# Patient Record
Sex: Female | Born: 1965 | Race: White | Hispanic: No | Smoking: Current some day smoker
Health system: Southern US, Community
[De-identification: ages and names within clinical notes are randomized; demographics above are authoritative.]

## PROBLEM LIST (undated history)

## (undated) DIAGNOSIS — F32A Depression, unspecified: Secondary | ICD-10-CM

## (undated) DIAGNOSIS — F329 Major depressive disorder, single episode, unspecified: Secondary | ICD-10-CM

## (undated) DIAGNOSIS — F419 Anxiety disorder, unspecified: Secondary | ICD-10-CM

## (undated) DIAGNOSIS — I10 Essential (primary) hypertension: Secondary | ICD-10-CM

## (undated) DIAGNOSIS — IMO0002 Reserved for concepts with insufficient information to code with codable children: Secondary | ICD-10-CM

## (undated) DIAGNOSIS — R011 Cardiac murmur, unspecified: Secondary | ICD-10-CM

## (undated) DIAGNOSIS — K219 Gastro-esophageal reflux disease without esophagitis: Secondary | ICD-10-CM

## (undated) DIAGNOSIS — E049 Nontoxic goiter, unspecified: Secondary | ICD-10-CM

## (undated) DIAGNOSIS — S82899A Other fracture of unspecified lower leg, initial encounter for closed fracture: Secondary | ICD-10-CM

## (undated) DIAGNOSIS — M1990 Unspecified osteoarthritis, unspecified site: Secondary | ICD-10-CM

## (undated) HISTORY — DX: Nontoxic goiter, unspecified: E04.9

## (undated) HISTORY — DX: Other fracture of unspecified lower leg, initial encounter for closed fracture: S82.899A

## (undated) HISTORY — DX: Depression, unspecified: F32.A

## (undated) HISTORY — PX: ANKLE FRACTURE SURGERY: SHX122

## (undated) HISTORY — DX: Gastro-esophageal reflux disease without esophagitis: K21.9

## (undated) HISTORY — DX: Anxiety disorder, unspecified: F41.9

## (undated) HISTORY — PX: TUBAL LIGATION: SHX77

## (undated) HISTORY — PX: OTHER SURGICAL HISTORY: SHX169

## (undated) HISTORY — PX: ANKLE SURGERY: SHX546

## (undated) HISTORY — DX: Cardiac murmur, unspecified: R01.1

## (undated) HISTORY — DX: Reserved for concepts with insufficient information to code with codable children: IMO0002

## (undated) HISTORY — PX: ABDOMINAL HYSTERECTOMY: SHX81

## (undated) HISTORY — DX: Major depressive disorder, single episode, unspecified: F32.9

## (undated) HISTORY — DX: Essential (primary) hypertension: I10

---

## 1991-11-18 HISTORY — PX: ABDOMINAL HYSTERECTOMY: SHX81

## 1995-11-18 HISTORY — PX: TUBAL LIGATION: SHX77

## 1999-04-19 ENCOUNTER — Ambulatory Visit (HOSPITAL_COMMUNITY): Admission: RE | Admit: 1999-04-19 | Discharge: 1999-04-19 | Payer: Self-pay | Admitting: Gynecology

## 2002-07-19 ENCOUNTER — Observation Stay (HOSPITAL_COMMUNITY): Admission: RE | Admit: 2002-07-19 | Discharge: 2002-07-20 | Payer: Self-pay | Admitting: Gynecology

## 2002-07-19 ENCOUNTER — Encounter (INDEPENDENT_AMBULATORY_CARE_PROVIDER_SITE_OTHER): Payer: Self-pay | Admitting: Specialist

## 2005-01-02 ENCOUNTER — Inpatient Hospital Stay (HOSPITAL_COMMUNITY): Admission: EM | Admit: 2005-01-02 | Discharge: 2005-01-07 | Payer: Self-pay | Admitting: Emergency Medicine

## 2005-01-14 ENCOUNTER — Inpatient Hospital Stay (HOSPITAL_COMMUNITY): Admission: RE | Admit: 2005-01-14 | Discharge: 2005-01-17 | Payer: Self-pay | Admitting: Orthopaedic Surgery

## 2005-08-25 ENCOUNTER — Encounter: Admission: RE | Admit: 2005-08-25 | Discharge: 2005-08-25 | Payer: Self-pay | Admitting: Orthopaedic Surgery

## 2006-05-10 IMAGING — CR DG CHEST 1V
1 series · 1 of 1 positions shown · non-contrast
Comparison: none

CLINICAL DATA: Preop respiratory exam for right ankle fracture repair.
 SINGLE VIEW CHEST:
 Cardiomediastinal silhouette is unremarkable.  Lungs are clear.  Bony thorax and upper abdomen are grossly unremarkable.

[view not recorded]
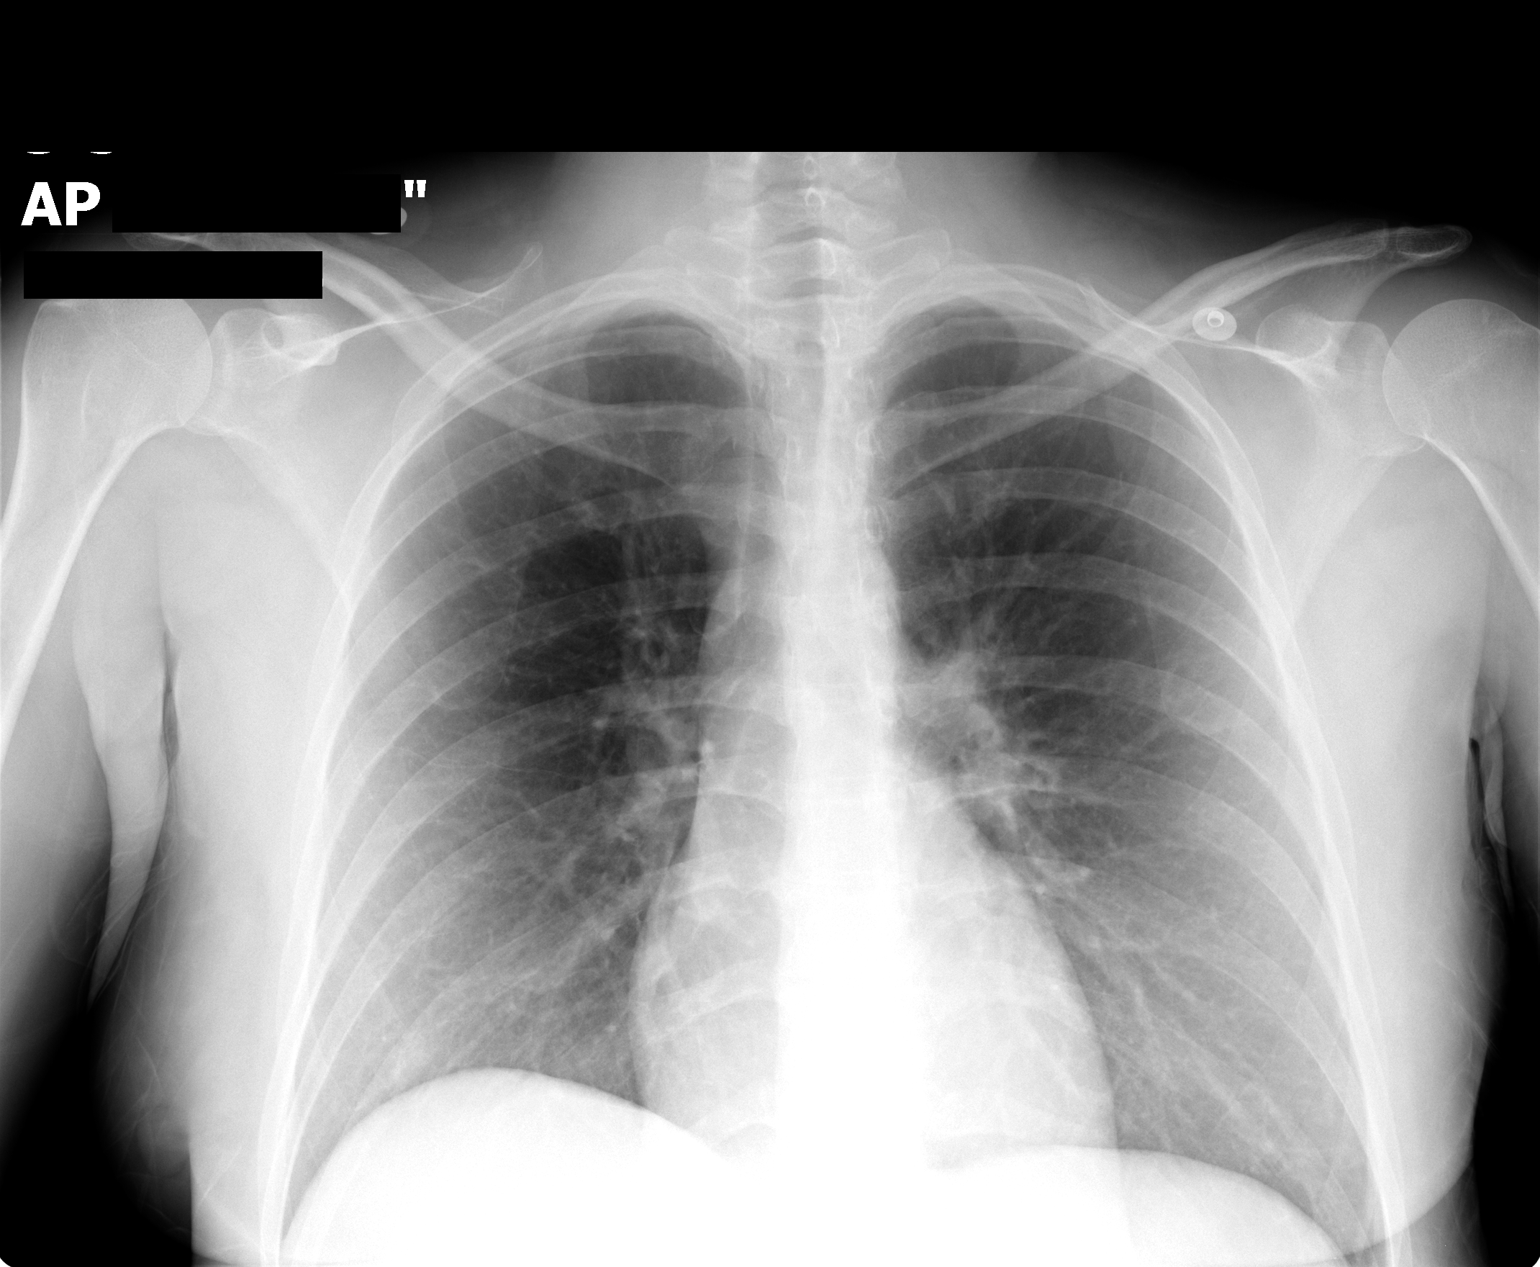

[1 of 1 positions shown; findings below may reference images not displayed]

IMPRESSION: No evidence of active cardiopulmonary disease.

## 2006-05-10 IMAGING — CR DG ANKLE 2V *R*
2 series · 2 of 2 positions shown · non-contrast
Comparison: none

CLINICAL DATA: 38-year-old; jumped off a rope swing; injured ankle
 TWO VIEW RIGHT ANKLE:
 There are severely comminuted fractures involving the distal tibia and fibula.  The ankle mortise is widened laterally.  There is air in the soft tissues and appears to be an open fracture.

[view not recorded (1 of 2)]
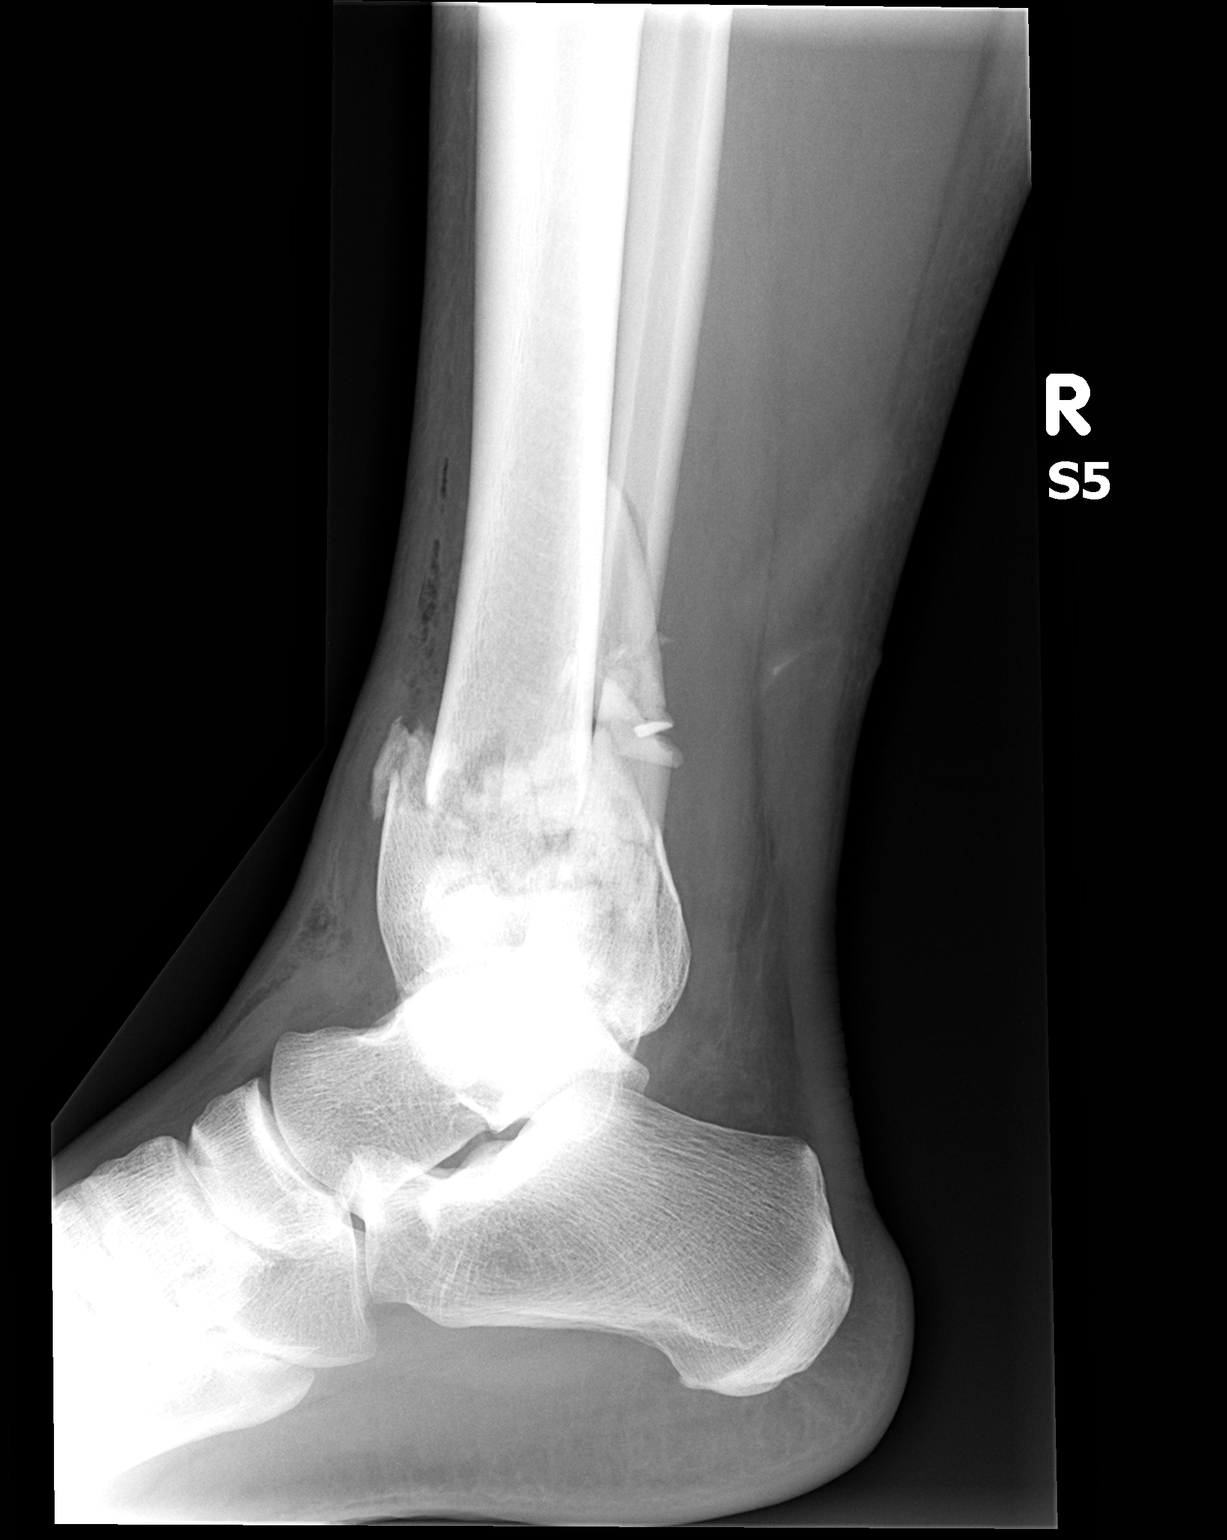

[view not recorded (2 of 2)]
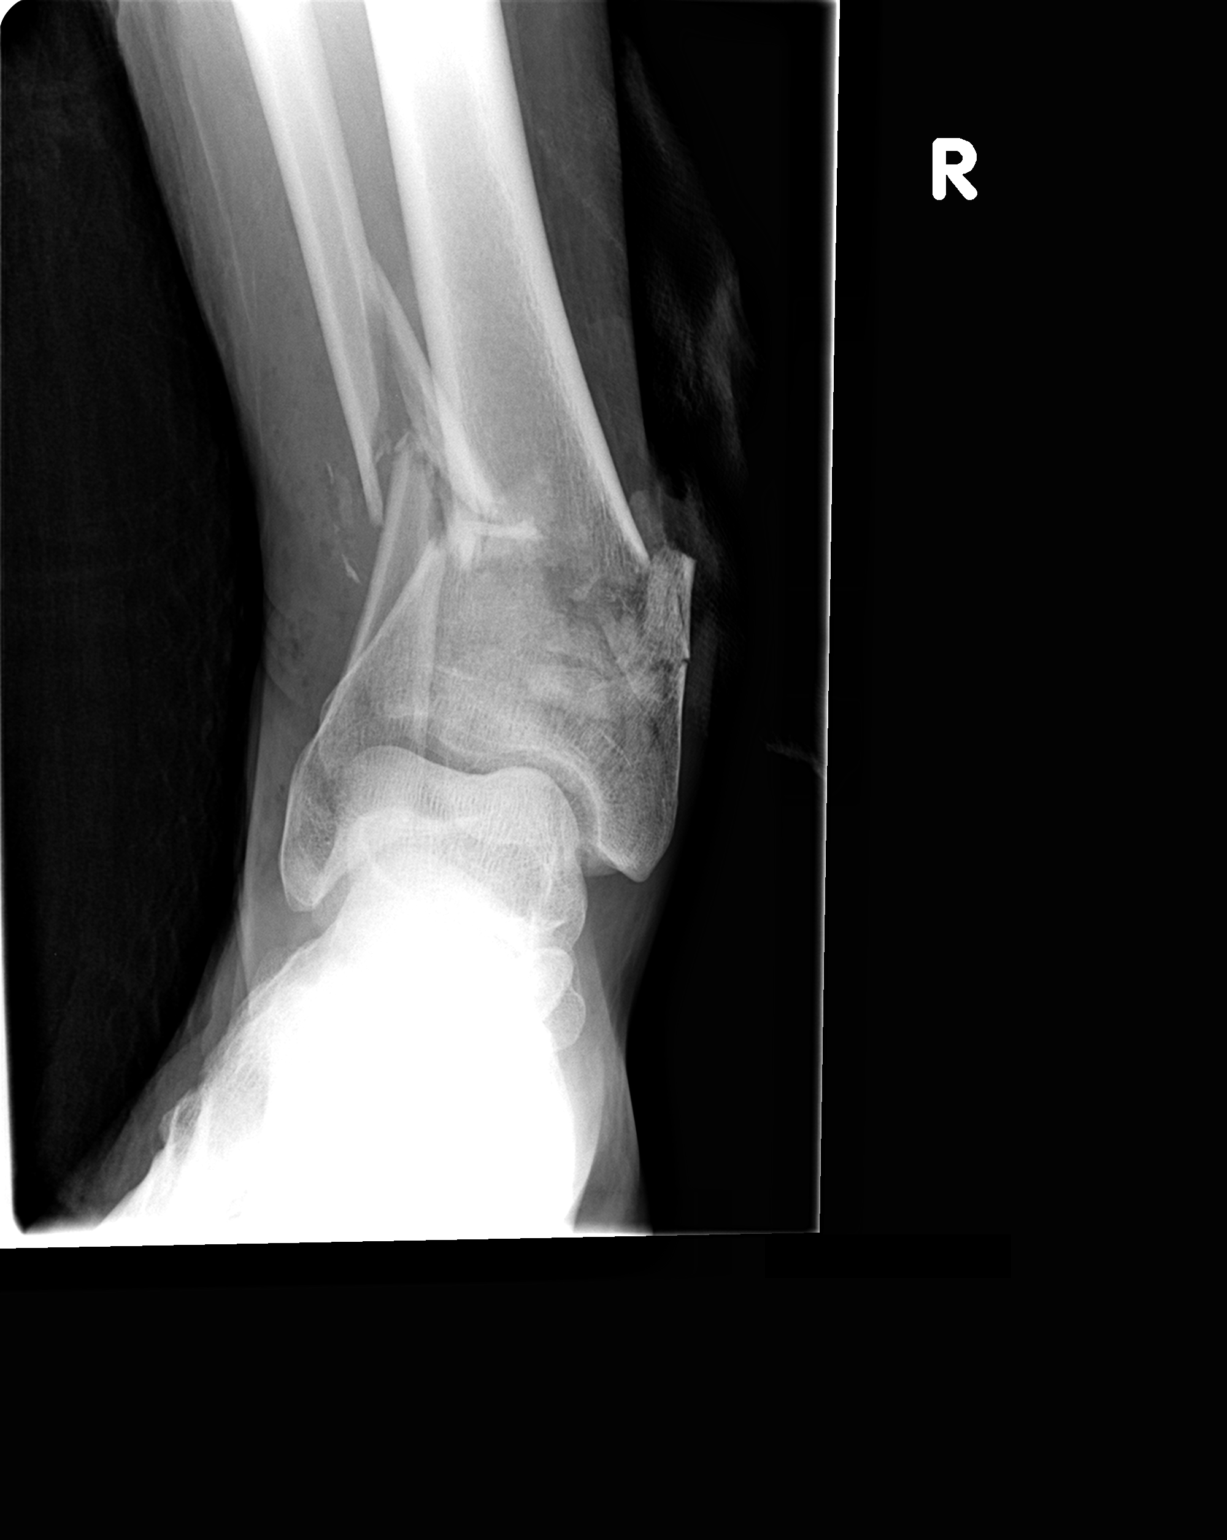

[2 of 2 positions shown; findings below may reference images not displayed]

IMPRESSION: Severely comminuted distal tibia and fibular fractures.

## 2006-05-11 IMAGING — RF DG ANKLE COMPLETE 3+V*R*
1 series · 3 of 3 positions shown · non-contrast
Comparison: none

CLINICAL DATA: 38-year-old, ankle fracture.  
 RIGHT ANKLE:
 Three fluoroscopic spot views of the ankle demonstrate external fixator placement.  The ankle mortis is restored.  Complex distal tibia and fibular fractures with improved anatomic alignment.

[Series 1: run · 3 of 3 slices shown]
[im 1/3]
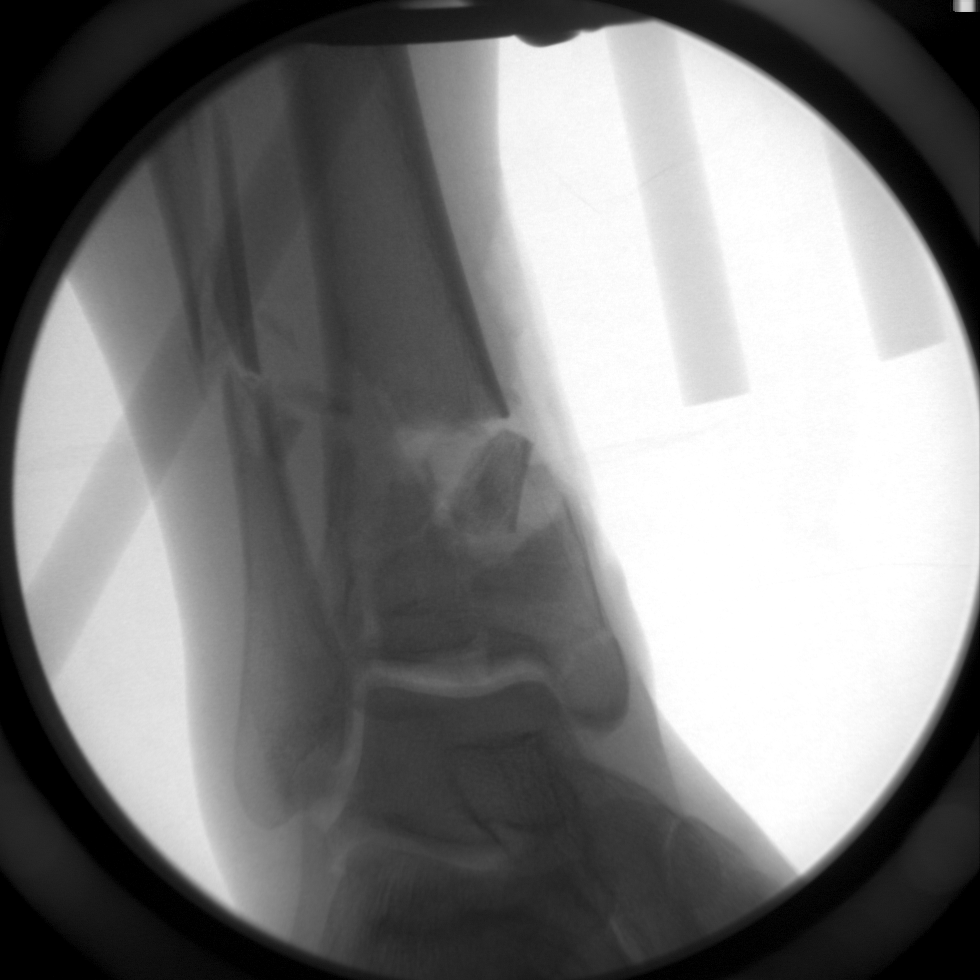
[im 2/3]
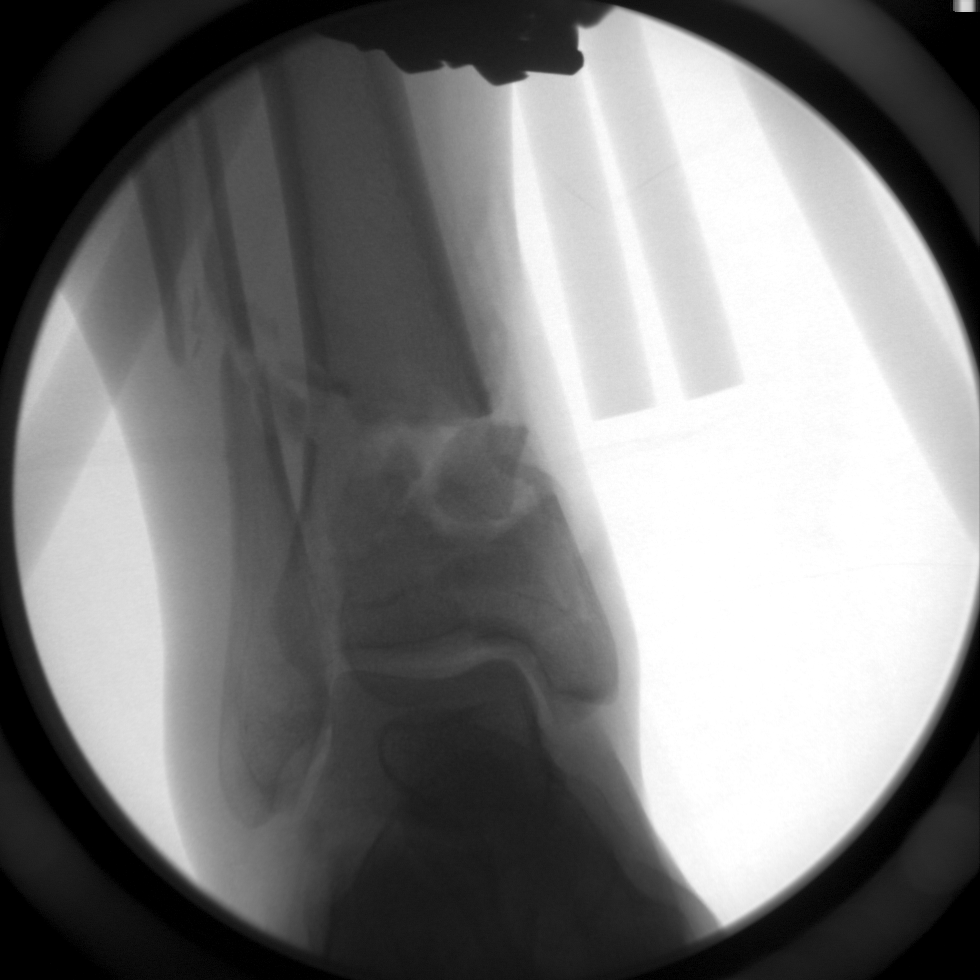
[im 3/3]
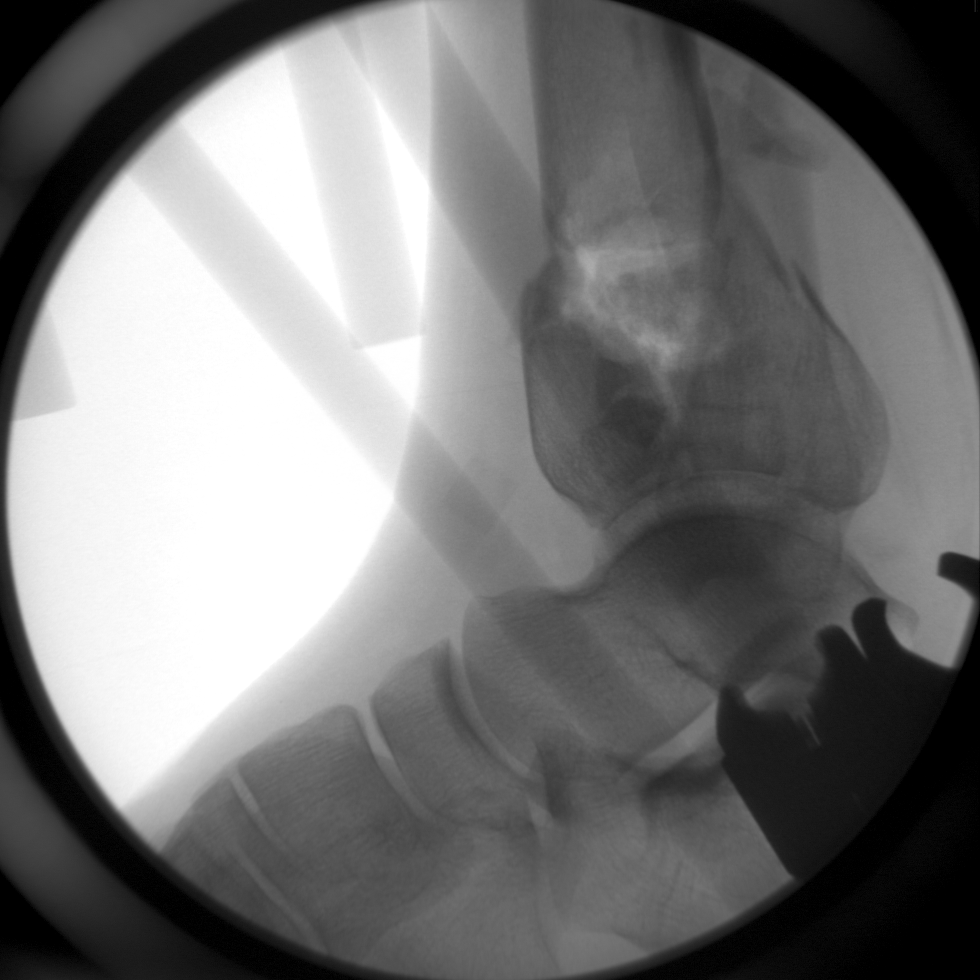

[3 of 3 positions shown; findings below may reference images not displayed]

IMPRESSION: Improved position and alignment of the comminuted tibia and fibular fractures with placement of external fixator.

## 2006-05-26 ENCOUNTER — Ambulatory Visit (HOSPITAL_COMMUNITY): Admission: RE | Admit: 2006-05-26 | Discharge: 2006-05-26 | Payer: Self-pay | Admitting: Orthopaedic Surgery

## 2007-07-16 ENCOUNTER — Encounter (INDEPENDENT_AMBULATORY_CARE_PROVIDER_SITE_OTHER): Payer: Self-pay | Admitting: Orthopaedic Surgery

## 2007-07-16 ENCOUNTER — Ambulatory Visit (HOSPITAL_COMMUNITY): Admission: RE | Admit: 2007-07-16 | Discharge: 2007-07-16 | Payer: Self-pay | Admitting: Orthopaedic Surgery

## 2008-08-18 ENCOUNTER — Ambulatory Visit (HOSPITAL_COMMUNITY): Admission: RE | Admit: 2008-08-18 | Discharge: 2008-08-19 | Payer: Self-pay | Admitting: Orthopaedic Surgery

## 2008-08-31 ENCOUNTER — Ambulatory Visit (HOSPITAL_COMMUNITY): Admission: RE | Admit: 2008-08-31 | Discharge: 2008-08-31 | Payer: Self-pay | Admitting: Family Medicine

## 2009-04-26 ENCOUNTER — Encounter: Admission: RE | Admit: 2009-04-26 | Discharge: 2009-07-25 | Payer: Self-pay | Admitting: Anesthesiology

## 2009-05-01 ENCOUNTER — Ambulatory Visit: Payer: Self-pay | Admitting: Anesthesiology

## 2009-05-24 ENCOUNTER — Encounter (HOSPITAL_COMMUNITY): Admission: RE | Admit: 2009-05-24 | Discharge: 2009-08-16 | Payer: Self-pay | Admitting: Anesthesiology

## 2009-05-29 ENCOUNTER — Ambulatory Visit: Payer: Self-pay | Admitting: Anesthesiology

## 2009-06-12 ENCOUNTER — Ambulatory Visit: Payer: Self-pay | Admitting: Anesthesiology

## 2009-07-05 ENCOUNTER — Encounter: Admission: RE | Admit: 2009-07-05 | Discharge: 2009-10-03 | Payer: Self-pay | Admitting: Anesthesiology

## 2009-07-10 ENCOUNTER — Ambulatory Visit: Payer: Self-pay | Admitting: Anesthesiology

## 2009-09-04 ENCOUNTER — Ambulatory Visit: Payer: Self-pay | Admitting: Anesthesiology

## 2011-04-01 NOTE — Assessment & Plan Note (Signed)
[name] comes to the pain management today.  I evaluated her via  the health and history form and 14-point review of systems.   1. Still complaining of quite a bit of foot pain, she was a fair      responder to the facet and/or to the lumbar sympathetic block, did      not respond well enough to go onto a third block.  To this end, we      talked to her about other options, and that includes modifiable      features in health profile such as cigarette cessation for best      outcome, and dorsal column stimulation.  I gave her some      information on dorsal column stimulation and discussed in lay      terms.  2. Full informed consent UDS.  She has a clean urine that might help      [place] free herself a bit.  I am going to go ahead and give her some      tramadol, to see how she does with that, and I reviewed her other      medications.   Objectively, diffuse paralumbar myofascial.  Fortin test positive, side  bending.  Her foot is typical with presentations and pull away, no  advancing pseudomotor changes.  Good vascular integrity.   IMPRESSION:  Peripheral neuropathy, unspecified complex regional pain  syndrome.   PLAN:  Conservative management.  Discharge instructions given include  follow up to determine further course of care.           ______________________________  Celene Kras, MD     HH/MedQ  D:  07/10/2009 10:43:14  T:  07/11/2009 03:19:54  Job #:  [id number]

## 2011-04-01 NOTE — Procedures (Signed)
NAME:  TAMLYN, SIDES NO.:  [id number]   MEDICAL RECORD NO.:  [id number]          PATIENT TYPE:  REC   LOCATION:  TPC                          FACILITY:  [facility]   PHYSICIAN:  Celene Kras, MD        DATE OF BIRTH:  [birth year]   DATE OF PROCEDURE:  06/12/2009  DATE OF DISCHARGE:                               OPERATIVE REPORT   [name].  We  evaluated her via health and history form 14-point review of systems.  1. I plan that to go on to reinforce the sympathetic block right side      under local anesthetic and she is planned L2-3.  She is a      responder.  Improved endurance range of motion and quality of life      activities.  2. Came back with marijuana and drug screen, I relate that to her, and      I will going to have to continue with conservative management here,      and I do want to avoid controlled substances if possible.   Objectively, diffuse paracervical myofascial, positive cervical facetal  compression test, right and left.  Diffuse paralumbar myofascial  comfort.  Pain with extension and side bending.  Range of motion  impaired, right ankle with some pull away, no apparent pseudomotor  changes, actually the color looks a little better.  Good vascular  integrity.   CRPS, peripheral neuropathy unspecified right lower extremity, atypical  CRPS.   PLAN:  Lumbar sympathetic block.  She is consented.   The patient was taken to the fluoroscopy suite and placed in the prone  position.  The back was prepped and draped in the usual fashion.  Using  a 22 gauge spinal needle, I advanced the anterior lateral surface of L2-  3, and confirmed placement with multiple fluoroscopic positions.  Test  uneventfully.  I used Isovue 200, then inject 8 mL divided dose 1%  lidocaine, 1% MPF, and Marcaine 0.25% MPF, and 40 mg of Aristocort.   She tolerated the procedure well.  Appropriate response to block.  Discharge instructions given.  We will see her in followup.  Consider  another block to see how she is doing here.           ______________________________  Celene Kras, MD     HH/MEDQ  D:  06/12/2009 12:11:37  T:  06/13/2009 02:00:07  Job:  [id number]

## 2011-04-01 NOTE — Op Note (Signed)
NAME:  [name], [name]              ACCOUNT NO.:  [id number]   MEDICAL RECORD NO.:  [id number]          PATIENT TYPE:  OIB   LOCATION:  5028                         FACILITY:  [facility]   PHYSICIAN:  Vanita Panda. Magnus Ivan, M.D.DATE OF BIRTH:  February 21, 1966   DATE OF PROCEDURE:  08/18/2008  DATE OF DISCHARGE:                               OPERATIVE REPORT   PREOPERATIVE DIAGNOSES:  1. Painful prominent hardware, right ankle.  2. Questionable sensory nerve neuromas, right ankle and foot.   POSTOPERATIVE DIAGNOSES:  1. Painful prominent hardware, right ankle.  2. Questionable sensory nerve neuromas, right ankle and foot.   PROCEDURES:  1. Removal of plate and screws from right ankle through 3 incisions.  2. Decompression and neurolysis of right superficial peroneal nerve      and right saphenous nerve.   SURGEON:  Vanita Panda. Magnus Ivan, MD   ANESTHESIA:  General.   ANTIBIOTICS:  1 g IV Ancef.   TOURNIQUET TIME:  1 hour and 30 minutes.   BLOOD LOSS:  Minimal.   COMPLICATIONS:  None.   INDICATIONS:  Briefly, [name] is a 45 year old who over 3 years ago,  sustained a severe open pilon fracture to her right ankle.  She  underwent external fixation followed by eventually internal fixation  with an anterior lateral plate, medial plate, and plating of the fibula.  She had gone on to heal the fracture, but has persistent pain at the  ankle from prominence of the medial hardware as well as sensory issues  across her foot with positive Tinel sign and sensory issues at the skin.  I recommended she undergo hardware removal because the fracture had  healed with exploration, decompression of the nerves to hopefully  temporize her pain.  We put her through all different types of  medications to try to sensitize in terms of trying to get improvement  and after 3 years, this has not improved.  The risks and benefits of  surgery were explained to her in length including the risk of blood  loss  and the risk of continued pain following surgery.  She agreed to proceed  with surgery.   DESCRIPTION OF PROCEDURE:  After informed consent was obtained,  appropriate right ankle was marked.  She was brought to the operating  room and placed supine on the operating table.  General anesthesia was  obtained.  A nonsterile tourniquet was placed around her upper right  thigh.  Her right leg and ankle and foot were prepped and draped with  DuraPrep and sterile drapes.  A time-out was called to identify the  correct patient and the correct right ankle.  I then used an Esmarch to  wrap out the ankle and tourniquet was inflated to 300 mm of pressure.  First, I went through the medial incision and dissected down to the  plate and screws.  There was bone that had grown up over the plate and  screws.  I was able to easily remove this with an osteotome and a  rongeur.  I removed the screws and plate then without complications.  I  then explored  the saphenous nerve and the sensory nerves in this area.  There was abundant scar tissue around all of these and I tried to  neurolyse this as much as I could.  I then irrigated the tissues  copiously over this wound and closed the superficial tissue with  interrupted 2-0 Vicryl suture followed by interrupted 2-0 nylon on the  skin.  Next, I turned the attention to the lateral side of the ankle.  I  made incision directly over the previous incision over the lateral  malleolus and dissected down to the bone.  I was able to remove the  plate and screws in its entirety on this part of the ankle after using  an osteotome to remove the bone that had grown up over the plate.  I  explored the superficial peroneal nerve and found abundant scar around  the nerve.  I then copiously irrigated the tissue in this area and  closed that with a 2-0 Vicryl followed by a 2-0 nylon.  Finally, I made  incision over the anterior lateral aspect of the ankle through the   previous incision for the anterolateral plating of the pilon fracture.  I dissected off the soft tissues and found the superficial peroneal  nerve with abundant scar tissue again in this area.  I tried to  neurolyse and decompress as best I could.  I then removed the plate and  screws in its entirety.  Of note, one screw had previously broken off in  the metaphyseal  section of bone and I left this buried in the bone so  as to not further disrupt soft tissue or bone.  This wound was then  copiously irrigated and closed the superficial tissue with interrupted 2-  0 Vicryl suture followed by interrupted 2-0 nylon on the skin.  A well-  padded sterile dressing was then applied over all wounds, which were  Xeroform dressing sponges, ABDs, Webril, and Ace wrap.  Tourniquet was  let down, and the toes did pink nicely.  The patient was awakened,  extubated, and taken to recovery room in stable condition.      Vanita Panda. Magnus Ivan, M.D.  Electronically Signed     CYB/MEDQ  D:  08/18/2008  T:  08/18/2008  Job:  [id number]

## 2011-04-01 NOTE — Op Note (Signed)
NAME:  [name], [name]              ACCOUNT NO.:  [id number]   MEDICAL RECORD NO.:  [id number]          PATIENT TYPE:  AMB   LOCATION:  SDS                          FACILITY:  [facility]   PHYSICIAN:  Vanita Panda. Magnus Ivan, M.D.DATE OF BIRTH:  Jul 23, 1966   DATE OF PROCEDURE:  07/16/2007  DATE OF DISCHARGE:                               OPERATIVE REPORT   PREOPERATIVE DIAGNOSIS:  Right foot plantar lesion.   POSTOPERATIVE DIAGNOSIS:  Right foot plantar lesion.   PROCEDURE:  Excisional biopsy, right foot, plantar lesion.   SURGEON:  Doneen Poisson, M.D.   ANESTHESIA:  General.   ANTIBIOTICS:  1 gram IV Ancef.   TOURNIQUET TIME:  18 minutes.   COMPLICATIONS:  None.   BLOOD LOSS:  Minimal.   INDICATIONS:  Briefly, [name] is a 45 year old that over 2 years ago  sustained trauma to her right ankle.  This was a pilon fracture that  underwent open reduction internal fixation.  She has since developed  some plantar fasciitis and actually had a nodule of tissue to develop in  the plantar fascia near the medial border of the foot.  I injected this  with Depo-Medrol Marcaine mixture at one point; it did ease some of the  pain, but she continued to have a bundle of tissue in this area.  I  recommended she undergo excisional biopsy because it was quite painful  to walk on.   PROCEDURE:  After informed consent was obtained and appropriate right  ankle was marked, she was brought to the operating room and placed  supine on the operative table.  General anesthesia was obtained.  Nonsterile tourniquet was placed around her upper thigh.  Her foot and  ankle were prepped and draped with DuraPrep and sterile drapes.  An  Esmarch was used to wrap the foot.  Tourniquet inflated to 300 mm of  pressure.  Time-out was called, and she was identified as the correct  patient and correct extremity.  Then, just to the medial border of the  lesion, I made a small incision and was able to spread  tissue from  around the lesion.  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  I then excised this area  and sent this to pathology for permanent identification.  I then  irrigated the tissues including skin with interrupted 3-0 nylon suture.  I infiltrated  the wound with 0.25% plain Marcaine.  Xeroform followed by a well-padded  sterile dressing was applied.  The tourniquet was let down and toes  pinkened nicely.  The patient was awakened, extubated and taken to the  recovery room in stable condition.      Vanita Panda. Magnus Ivan, M.D.  Electronically Signed     CYB/MEDQ  D:  07/16/2007  T:  07/17/2007  Job:  [id number]

## 2011-04-01 NOTE — Procedures (Signed)
NAME:  [name], SPLINTER NO.:  [id number]   MEDICAL RECORD NO.:  [id number]          PATIENT TYPE:  REC   LOCATION:  TPC                          FACILITY:  [facility]   PHYSICIAN:  Celene Kras, MD        DATE OF BIRTH:  1966-03-23   DATE OF PROCEDURE:  05/29/2009  DATE OF DISCHARGE:                               OPERATIVE REPORT   The patient comes in for pain management today, lumbar sympathetic block  right side.  Risks, complications, options are fully outlined.   1. I have reviewed the three-phase bone scan and agreed that it is a      bit vague, so this will support either peripheral neuropathy of      unspecified, or CRPS variant.  2. Full informed consent UDS.  Last one came back with marijuana and      she needs to have a clean drug screen before we can move forward      with any advance pharmacologic management, and I do want to      emphasize non-narcotic options.  3. Other modifiable features and health profile includes cigarette      cessation for best outcome.  We are hoping that these blocks are      formidable, allowing [place] to avoid controlled substance issues      altogether.   Objectively no significant interval change, discolored to the ankle some  pull away, pain with extension and side bending.  Nothing new  neurologically.   IMPRESSION:  Peripheral neuropathy, unspecified, complex regional pain  syndrome variant.   PLAN:  Lumbar sympathetic block right side.  She is consented, L2-L3.   The patient was taken to fluoroscopy suite and placed in prone position.  The back was prepped and draped in usual under local anesthetic.  I  advanced the anterior lateral surface of L2-L3 and confirmed placement  of multiple fluoroscopic positions.  I confirmed placement, used Isovue-  200.  No CSF, heme or paresthesia.  I then injected 8 mL lidocaine 1%  MPF and 40 mg of Aristocort.   She tolerated procedure well.  No complications from our procedure.  Appropriate recovery.  Discharge instructions given.  No barrier to  communication.           ______________________________  Celene Kras, MD     HH/MEDQ  D:  05/29/2009 12:03:20  T:  05/30/2009 05:16:31  Job:  [id number]

## 2011-04-01 NOTE — Assessment & Plan Note (Signed)
Joanie referred to [place] by Dr. Magnus Ivan.  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  Describing an occasional pseudomotor changes with swelling,  discoloration, edema, that progresses up to about midcalf, with  hypersensitivity, describing hyperpathia/allodynia.  This injury  occurred in L5, she has had ORIF, hardware removal, and has had  persistent pain.  She is disabled from this.  She had a resultant  difficult experience with this pain, and her situational depression led  to feelings of suicidal ideation, which has been resolved.  She has seen  Benedetto Goad for this.  States no wish to harm self or others at this  time, 4/10 on subjective scale, sitting comfortably, conversant, is very  appropriate.  She has poor restorative sleep capacity, made worse by  walking, standing, and rest.  She is improved with medication, sometimes  rest.  She does walk without assistance, sometimes has to use a cane.  She can drive, she has fair endurance.  She would like to consider some  type of vocational retraining.  A 14-point review of systems.  PMH was  remarkable.  For surgery, it is noted, he is followed by primary care.  She has a heart murmur, thyroid disease.  Smoker, 1 pack per day, I  counseled.  Alcohol use, I counseled.  She admits to using marijuana  products.  Full informed consent UDS.  She is married.  There is support  system at this time.   FAMILY HISTORY:  Diabetes and disability.   REVIEW OF SYSTEMS:  Otherwise noncontributory to pain problem.   FAMILY HISTORY:  Otherwise noncontributory to pain problem.   SOCIAL HISTORY:  Otherwise noncontributory to pain problem.   PHYSICAL EXAMINATION:  GENERAL:  Pleasant female, sitting comfortably,  came with a limp.  Affect appears normal and oriented x3.  HEENT:  Otherwise unremarkable.  CHEST:  Clear to auscultation and percussion.  Increased AP diameter.  She has regular rate  and rhythm without rub, murmur, or gallop.  ABDOMEN:  Soft, nontender, and benign.  No hepatosplenomegaly.  EXTREMITIES:  She has diffuse paralumbar myofascial discomfort.  Fortin  test positive.  She has right ankle pain, she has no clear evidence of  pseudomotor changes at this time, but pull away, and well-healed  incisions.  Good vascular integrity.  Capillary filling intact.  The  pain was with inversion and eversion.  NEUROLOGIC:  Nothing new neurologically.   IMPRESSION:  Complex regional pain syndrome, lower extremity variance.   PLAN:  1. Recommend three-phase bone scan to help further delineate.  2. Consider lumbar sympathetic block.  3. Full informed consent UDS, admits adulterine, she wants to clear      that up, friend gave her marijuana and cigarettes and she states it      helped her pain and sleep, but states she does not want to do that      anymore and we will follow that expectantly.  4. Enhance database followup with her in 2-3 weeks, repeat UDS,      consider advancing analgesic profile, sleep is an issue for, she is      not taking Xanax and I counseled as to impaired sleep latency, and      potential for algesia with Xanax, and recommend Klonopin as      alternative.  She is on Neurontin, continue that, consider      pharmacokinetic for long-acting agent such as Ultram ER something  along these lines with breakthrough med, if UDS appropriate.      Discharge instructions given.  No barrier to communication.           ______________________________  Celene Kras, MD     HH/MedQ  D:  05/01/2009 10:14:23  T:  05/02/2009 00:22:45  Job #:  [id number]   cc:   Vanita Panda. Magnus Ivan, M.D.  Fax: [phone]

## 2011-04-04 NOTE — Discharge Summary (Signed)
NAME:  [name], [name]              ACCOUNT NO.:  [id number]   MEDICAL RECORD NO.:  [id number]          PATIENT TYPE:  INP   LOCATION:  5035                         FACILITY:  [facility]   PHYSICIAN:  Vanita Panda. Magnus Ivan, M.D.DATE OF BIRTH:  May 15, 1966   DATE OF ADMISSION:  01/14/2005  DATE OF DISCHARGE:  01/17/2005                                 DISCHARGE SUMMARY   ADMITTING DIAGNOSIS:  Right ankle open pilon fracture, status post external  fixation and delayed primary closure.   DISCHARGE DIAGNOSIS:  Status post external fixation removal and definitive  fixation of right ankle pilon fracture with open reduction and internal  fixation utilizing plates and screws.   PROCEDURES:  1.  Removal of external fixation.  2.  Open reduction and internal fixation with allograft bone graft into the      right ankle pilon fracture on January 14, 2005.   HOSPITAL COURSE:  Briefly, [name] is a 45 year old female who a week and  a half prior to admission was swinging on a vine, and she fell approximately  three feet, landing on her right ankle.  She sustained an open ankle pilon  fracture.  She was brought to the operating room for irrigation and  debridement and external fixation and then subsequent repeat irrigation and  debridement and closure of the wound.  She was discharged and then  subsequently returns at a week and a half for definitive fixation with  plating.  The risks and benefits of this were explained to her.  For a  detailed description of the surgery, please refer to the dictated operative  note in the patient's medical record.  On the day of admission, she was  taken to the operating room, and the external fixator was removed, and  plating was removed.  She tolerated this well, without complications.  Postoperatively, she was placed in a regular splint and began working with  physical therapy with nonweightbearing on that ankle, and crutch,  wheelchair, and walker training  only.  By the day of discharge, she  demonstrated the ability to do these fine and was cleared from a physical  therapy standpoint for discharge to home.  She was tolerating oral pain  medications as well as a regular diet.   DISPOSITION:  To home.   DISCHARGE MEDICATIONS:  1.  Narcotic pain medications.  2.  Nausea medications.   FOLLOW-UP PLAN:  She will follow up in my office in one week for assessment  of her wound.  While at home, she will continue with using her walker and  putting no weight on her leg.  She was given appropriate numbers for  followup as well.     CYB/MEDQ  D:  03/06/2005  T:  03/07/2005  Job:  [id number]

## 2011-04-04 NOTE — H&P (Signed)
NAME:  [name], [name]              ACCOUNT NO.:  [id number]   MEDICAL RECORD NO.:  [id number]          PATIENT TYPE:  INP   LOCATION:  1824                         FACILITY:  [facility]   PHYSICIAN:  Vanita Panda. Magnus Ivan, M.D.DATE OF BIRTH:  [birth year]   DATE OF ADMISSION:  01/02/2005  DATE OF DISCHARGE:                                HISTORY & PHYSICAL   REASON FOR ADMISSION:  Right open ankle fracture.   HISTORY OF PRESENT ILLNESS:  [name] is a 45 year old female who was  swinging on a vine this evening when she fell approximately six feet on to  her right ankle. She sustained a crushing injury to the ankle and an obvious  open injury. She is brought for evaluation to [facility]  Emergency Department. It was noted to be an obvious open ankle fracture. X-  rays were obtained and orthopedic surgery service was consulted.   The patient denied any numbness and tingling in her foot and denied any  other injuries.   PAST MEDICAL HISTORY:  Negative.   PAST SURGICAL HISTORY:  Negative.   ALLERGIES:  No known drug allergies.   MEDICATIONS:  None.   SOCIAL HISTORY:  She does work as a Leisure centre manager and lives in [place]. She says she does drink on occasion and smokes approximately two  packs a day. She denies any drug use.   REVIEW OF SYMPTOMS:  Negative for chest pain, shortness of breath, nausea,  vomiting, fever, chills, GI, or GU symptoms.   PHYSICAL EXAMINATION:  VITAL SIGNS:  Afebrile. Vital signs stable.  GENERAL:  This is an alert and oriented female in obvious discomfort but no  acute distress.  HEENT:  Normocephalic and atraumatic. Pupils are equal, round, and reactive  to light.  NECK:  Supple.  LUNGS:  Clear to auscultation bilaterally.  HEART:  Regular rate and rhythm.  ABDOMEN:  Soft, nontender, and nondistended. Normal active bowel sounds.  EXTREMITIES:  Examination of the right lower extremity shows obvious open  injury on the medial  aspect of the ankle with an approximately 2 by 3 cm  open wound with exposed fracture in medial tibia. The foot has palpable  dorsalis pedis and posterior tibial pulses and her sensation is normal. She  has a well perfused foot.   LABORATORY DATA:  X-rays are done and show a highly comminuted talon  fracture with a comminuted fibula fracture as well. The ankle joint itself  seems to be well maintained.   IMPRESSION:  This is a 45 year old with an right open talon fracture.   PLAN:  She will be taken to the operating room for irrigation, debridement,  and external fixation of the talon fracture. She will be given IV  antibiotics and admitted to the orthopedic surgery service. She will return  to the operating room in approximately 48 hours for a repeat I&D and  hopefully more permanent fixation depending upon the soft tissue response.      CYB/MEDQ  D:  01/02/2005  T:  01/02/2005  Job:  [id number]

## 2011-08-18 LAB — COMPREHENSIVE METABOLIC PANEL
ALT: 14
ALT: 20
AST: 46 — ABNORMAL HIGH
AST: 19
Albumin: 4.2
Albumin: 3.7
Alkaline Phosphatase: 97
Alkaline Phosphatase: 78
BUN: 4 — ABNORMAL LOW
BUN: 7
CO2: 22
CO2: 23
Calcium: 9.9
Calcium: 9.1
Chloride: 104
Chloride: 107
Creatinine, Ser: 0.68
Creatinine, Ser: 0.63
GFR calc Af Amer: >60
GFR calc Af Amer: >60
GFR calc non Af Amer: >60
GFR calc non Af Amer: >60
Glucose, Bld: 100 — ABNORMAL HIGH
Glucose, Bld: 109 — ABNORMAL HIGH
Potassium: 6.5
Potassium: 4.1
Sodium: 136
Sodium: 136
Total Bilirubin: 2.6 — ABNORMAL HIGH
Total Bilirubin: 0.5
Total Protein: 6.7
Total Protein: 6.5

## 2011-08-18 LAB — URINALYSIS, ROUTINE W REFLEX MICROSCOPIC
Bilirubin Urine: NEGATIVE
Glucose, UA: NEGATIVE
Hgb urine dipstick: NEGATIVE
Ketones, ur: NEGATIVE
Nitrite: NEGATIVE
Protein, ur: NEGATIVE
Specific Gravity, Urine: 1.001 — ABNORMAL LOW
Urobilinogen, UA: 0.2
pH: 7

## 2011-08-18 LAB — CBC
HCT: 43.6
Hemoglobin: 15.4 — ABNORMAL HIGH
MCHC: 35.3
MCV: 94
Platelets: 228
RBC: 4.64
RDW: 12.9
WBC: 10.5

## 2011-08-18 LAB — PROTIME-INR
INR: 0.9
Prothrombin Time: 12.2

## 2011-08-18 LAB — APTT: aPTT: 31

## 2011-08-29 LAB — BASIC METABOLIC PANEL
BUN: 7
CO2: 26
Calcium: 9.3
Chloride: 104
Creatinine, Ser: 0.69
GFR calc Af Amer: >60
GFR calc non Af Amer: >60
Glucose, Bld: 126 — ABNORMAL HIGH
Potassium: 3.9
Sodium: 137

## 2011-08-29 LAB — CBC
Hemoglobin: 14.7
MCHC: 34.3
RBC: 4.47

## 2011-09-25 ENCOUNTER — Other Ambulatory Visit: Payer: Self-pay | Admitting: Family Medicine

## 2011-09-25 DIAGNOSIS — Z1231 Encounter for screening mammogram for malignant neoplasm of breast: Secondary | ICD-10-CM

## 2011-10-20 ENCOUNTER — Ambulatory Visit
Admission: RE | Admit: 2011-10-20 | Discharge: 2011-10-20 | Disposition: A | Discharge status: Home / Self Care | Payer: PRIVATE HEALTH INSURANCE | Source: Ambulatory Visit | Attending: Family Medicine | Admitting: Family Medicine

## 2011-10-20 DIAGNOSIS — Z1231 Encounter for screening mammogram for malignant neoplasm of breast: Secondary | ICD-10-CM

## 2012-12-01 ENCOUNTER — Other Ambulatory Visit: Payer: Self-pay | Admitting: Family Medicine

## 2012-12-01 DIAGNOSIS — Z1231 Encounter for screening mammogram for malignant neoplasm of breast: Secondary | ICD-10-CM

## 2012-12-24 ENCOUNTER — Ambulatory Visit
Admission: RE | Admit: 2012-12-24 | Discharge: 2012-12-24 | Disposition: A | Discharge status: Home / Self Care | Payer: PRIVATE HEALTH INSURANCE | Source: Ambulatory Visit | Attending: Family Medicine | Admitting: Family Medicine

## 2012-12-24 DIAGNOSIS — Z1231 Encounter for screening mammogram for malignant neoplasm of breast: Secondary | ICD-10-CM

## 2014-06-10 ENCOUNTER — Encounter (HOSPITAL_COMMUNITY): Payer: Self-pay | Admitting: Emergency Medicine

## 2014-06-10 ENCOUNTER — Emergency Department (HOSPITAL_COMMUNITY)
Admission: EM | Admit: 2014-06-10 | Discharge: 2014-06-10 | Disposition: A | Discharge status: Home / Self Care | Payer: PRIVATE HEALTH INSURANCE | Attending: Emergency Medicine | Admitting: Emergency Medicine

## 2014-06-10 ENCOUNTER — Emergency Department (HOSPITAL_COMMUNITY): Payer: PRIVATE HEALTH INSURANCE

## 2014-06-10 DIAGNOSIS — Z8739 Personal history of other diseases of the musculoskeletal system and connective tissue: Secondary | ICD-10-CM | POA: Insufficient documentation

## 2014-06-10 DIAGNOSIS — W010XXA Fall on same level from slipping, tripping and stumbling without subsequent striking against object, initial encounter: Secondary | ICD-10-CM | POA: Insufficient documentation

## 2014-06-10 DIAGNOSIS — Z79899 Other long term (current) drug therapy: Secondary | ICD-10-CM | POA: Diagnosis not present

## 2014-06-10 DIAGNOSIS — Z862 Personal history of diseases of the blood and blood-forming organs and certain disorders involving the immune mechanism: Secondary | ICD-10-CM | POA: Insufficient documentation

## 2014-06-10 DIAGNOSIS — S99919A Unspecified injury of unspecified ankle, initial encounter: Secondary | ICD-10-CM

## 2014-06-10 DIAGNOSIS — S82409A Unspecified fracture of shaft of unspecified fibula, initial encounter for closed fracture: Secondary | ICD-10-CM | POA: Diagnosis not present

## 2014-06-10 DIAGNOSIS — S99929A Unspecified injury of unspecified foot, initial encounter: Secondary | ICD-10-CM | POA: Diagnosis present

## 2014-06-10 DIAGNOSIS — Z8639 Personal history of other endocrine, nutritional and metabolic disease: Secondary | ICD-10-CM | POA: Insufficient documentation

## 2014-06-10 DIAGNOSIS — S8990XA Unspecified injury of unspecified lower leg, initial encounter: Secondary | ICD-10-CM | POA: Diagnosis present

## 2014-06-10 DIAGNOSIS — Z87891 Personal history of nicotine dependence: Secondary | ICD-10-CM | POA: Insufficient documentation

## 2014-06-10 DIAGNOSIS — Y9289 Other specified places as the place of occurrence of the external cause: Secondary | ICD-10-CM | POA: Diagnosis not present

## 2014-06-10 DIAGNOSIS — Y9389 Activity, other specified: Secondary | ICD-10-CM | POA: Diagnosis not present

## 2014-06-10 DIAGNOSIS — S82401A Unspecified fracture of shaft of right fibula, initial encounter for closed fracture: Secondary | ICD-10-CM

## 2014-06-10 HISTORY — DX: Unspecified osteoarthritis, unspecified site: M19.90

## 2014-06-10 MED ORDER — IBUPROFEN 600 MG PO TABS: 600.0000 mg | ORAL_TABLET | Freq: Four times a day (QID) | ORAL | Status: DC | PRN

## 2014-06-10 MED ORDER — IBUPROFEN 800 MG PO TABS
800.0000 mg | ORAL_TABLET | Freq: Once | ORAL | Status: AC
  Administered 2014-06-10: 800 mg via ORAL
  Filled 2014-06-10: qty 1

## 2014-06-10 MED ORDER — OXYCODONE-ACETAMINOPHEN 5-325 MG PO TABS: 1.0000 | ORAL_TABLET | ORAL | Status: DC | PRN

## 2014-06-10 MED ORDER — OXYCODONE-ACETAMINOPHEN 5-325 MG PO TABS
1.0000 | ORAL_TABLET | Freq: Once | ORAL | Status: AC
  Administered 2014-06-10: 1 via ORAL
  Filled 2014-06-10: qty 1

## 2014-06-10 NOTE — ED Notes (Signed)
Hurt right foot last night after falling.  Pt has on boot from a previous visit and currently wearing brace.  Rating pain to right foot and leg 10.  Have not taken any medication for pain.

## 2014-06-10 NOTE — Discharge Instructions (Signed)
Fibular Fracture, Ankle, Adult, Treated With or Without Immobilization A fibular fracture at your ankle is a break (fracture) bone in the smallest of the two bones in your lower leg, located on the outside of your leg (fibula) close to the area at your ankle joint. CAUSES  Rolling your ankle.  Twisting your ankle.  Extreme flexing or extending of your foot.  Severe force on your ankle as when falling from a distance. RISK FACTORS  Jumping activities.  Participation in sports.  Osteoporosis.  Advanced age.  Previous ankle injuries. SIGNS AND SYMPTOMS  Pain.  Swelling.  Inability to put weight on injured ankle.  Bruising.  Bone deformities at site of injury. DIAGNOSIS  This fracture is diagnosed with the help of an X-ray exam. TREATMENT  If the fractured bone did not move out of place it usually will heal without problems and does casting or splinting. If immobilization is needed for comfort or the fractured bone moved out of place and will not heal properly with immobilization, a cast or splint will be used. HOME CARE INSTRUCTIONS   Apply ice to the area of injury:  Put ice in a plastic bag.  Place a towel between your skin and the bag.  Leave the ice on for 20 minutes, 2-3 times a day.  Use crutches as directed. Resume walking without crutches as directed by your health care provider.  Only take over-the-counter or prescription medicines for pain, discomfort, or fever as directed by your health care provider.  If you have a removable splint or boot, do not remove the boot unless directed by your health care provider. SEEK MEDICAL CARE IF:   You have continued pain or more swelling  The medications do not control the pain. SEEK IMMEDIATE MEDICAL CARE IF:  You develop severe pain in the leg or foot.  Your skin or nails below the injury turn blue or grey or feel cold or numb. MAKE SURE YOU:   Understand these instructions.  Will watch your  condition.  Will get help right away if you are not doing well or get worse. Document Released: 11/03/2005 Document Revised: 08/24/2013 Document Reviewed: 06/15/2013 [facility] Patient Information 2015 [place], [place]. This information is not intended to replace advice given to you by your health care provider. Make sure you discuss any questions you have with your health care provider.    Wear your cam walker and use crutches to comfort.  Rest,  Elevate as much as possible and use ice as much as possible for the next 2 days to help with pain and swelling.  Use the medications as prescribed.  Do not drive within 4 hours of taking oxycodone as this medication will make you drowsy.  Call Dr. Aline Brochure for further evaluation and management of your injury this week.

## 2014-06-12 NOTE — ED Provider Notes (Signed)
CSN: [id number]     Arrival date & time 06/10/14  1505 History   First MD Initiated Contact with Patient 06/10/14 1537     Chief Complaint  Patient presents with  . Foot Pain     (Consider location/radiation/quality/duration/timing/severity/associated sxs/prior Treatment) HPI  [name] is a 48 y.o. female presenting with persistent foot and right ankle pain after tripping last night and falling.  She reports it happened so fast she cannot be sure exactly how she injured the extremity, inversion vs eversion,etc.  This same ankle was seriously fractured in 2006 with multiple revision surgeries and development of rsd post surgery.  Her pain is constant and seems to originate in the lateral ankle, but radiates to the mid lateral foot. It is constant and throbbing.  She has used ice and elevation and presents in her cam walker and crutches, with this treatment not relieving her pain.  She denies numbness in the extremity.    Past Medical History  Diagnosis Date  . Arthritis   . Thyroid disease    Past Surgical History  Procedure Laterality Date  . Abdominal hysterectomy    . Fracture surgery     History reviewed. No pertinent family history. History  Substance Use Topics  . Smoking status: Former Smoker    Quit date: 06/24/2011  . Smokeless tobacco: Not on file  . Alcohol Use: Not on file   OB History   Grav Para Term Preterm Abortions TAB SAB Ect Mult Living                 Review of Systems  Constitutional: Negative for fever.  Musculoskeletal: Positive for arthralgias and joint swelling. Negative for myalgias.  Neurological: Negative for weakness and numbness.      Allergies  Review of patient's allergies indicates no known allergies.  Home Medications   Prior to Admission medications   Medication Sig Start Date End Date Taking? Authorizing Provider  ALPRAZolam Duanne Moron) 0.5 MG tablet Take 0.5 mg by mouth daily as needed. For anxiety 05/11/14  Yes Historical  Provider, MD  BIOTIN PO Take 1 tablet by mouth daily.   Yes Historical Provider, MD  buPROPion (WELLBUTRIN XL) 300 MG 24 hr tablet Take 300 mg by mouth every morning. 06/05/14  Yes Historical Provider, MD  FLUoxetine (PROZAC) 40 MG capsule Take 40 mg by mouth 2 (two) times daily.   Yes Historical Provider, MD  Multiple Vitamin (MULTIVITAMIN WITH MINERALS) TABS tablet Take 1 tablet by mouth daily.   Yes Historical Provider, MD  ibuprofen (ADVIL,MOTRIN) 600 MG tablet Take 1 tablet (600 mg total) by mouth every 6 (six) hours as needed. 06/10/14   Evalee Jefferson, PA-C  oxyCODONE-acetaminophen (PERCOCET/ROXICET) 5-325 MG per tablet Take 1 tablet by mouth every 4 (four) hours as needed. 06/10/14   Evalee Jefferson, PA-C   BP 149/91  Pulse 84  Temp(Src) 99 F (37.2 C) (Oral)  Ht 5\' 8"  (1.727 m)  Wt 185 lb (83.915 kg)  BMI 28.14 kg/m2  SpO2 98% Physical Exam  Constitutional: She appears well-developed and well-nourished.  HENT:  Head: Atraumatic.  Neck: Normal range of motion.  Cardiovascular:  Pulses equal bilaterally  Musculoskeletal: She exhibits tenderness.       Right ankle: She exhibits swelling. She exhibits no ecchymosis, no deformity, no laceration and normal pulse. Tenderness. Lateral malleolus tenderness found. No head of 5th metatarsal and no proximal fibula tenderness found. Achilles tendon normal.  Well healed surgical/wound incisions bilateral right ankle.  Neurological:  She is alert. She has normal strength. She displays normal reflexes. No sensory deficit.  Skin: Skin is warm and dry.  Psychiatric: She has a normal mood and affect.    ED Course  Procedures (including critical care time) Labs Review Labs Reviewed - No data to display  Imaging Review Dg Ankle Complete Right  06/10/2014   CLINICAL DATA:  Right ankle injury and pain. History of remote injury and fracture of the right ankle.  EXAM: RIGHT ANKLE - COMPLETE 3+ VIEW  COMPARISON:  08/19/2007 radiographs  FINDINGS:  Posttraumatic and postsurgical changes within the distal fibula and tibia again noted.  An equivocal nondisplaced fracture of the distal fibula is noted on the oblique view only.  There is no evidence of subluxation or dislocation.  IMPRESSION: Equivocal nondisplaced distal fibular fracture -correlate with pain.  Posttraumatic and postsurgical changes within the distal fibula and tibia.   Electronically Signed   By: Hassan Rowan M.D.   On: 06/10/2014 15:51   Dg Foot Complete Right  06/10/2014   CLINICAL DATA:  FOOT PAIN  EXAM: RIGHT FOOT COMPLETE - 3+ VIEW  COMPARISON:  05/28/2012 and earlier studies  FINDINGS: Small calcaneal spur at the plantar aponeurosis. Negative for fracture or dislocation. Normal mineralization and alignment. No other significant osseous degenerative change. Regional soft tissues unremarkable.  IMPRESSION: 1. Small calcaneal spur.  Otherwise negative.   Electronically Signed   By: Arne Cleveland M.D.   On: 06/10/2014 15:51     EKG Interpretation None      MDM   Final diagnoses:  Closed fibular fracture, right, initial encounter    Patients labs and/or radiological studies were viewed and considered during the medical decision making and disposition process. Pt is tender over the distal fibula, favor new fx at this site.  Encouraged to continue wearing the cam walker, use crutches, ice,  Elevation.  Referral to ortho for further management.  Prefers local ortho rather than traveling back to [place].    Evalee Jefferson, PA-C 06/12/14 [phone]

## 2014-06-13 ENCOUNTER — Telehealth: Payer: Self-pay | Admitting: Orthopedic Surgery

## 2014-06-13 NOTE — Telephone Encounter (Signed)
Patient called following Emergency Room visit at [facility], 06/10/14, for problem of right ankle/foot injury - chart indicates fracture of ankle.  Patient relayed history of ankle injury and surgery several years ago, in 2006, on same foot/ankle.  She requests appointment for this week if possible, as states scheduled to be out of town next week. Please review and advise.  Patient ph# [phone].

## 2014-06-13 NOTE — ED Provider Notes (Signed)
Medical screening examination/treatment/procedure(s) were performed by non-physician practitioner and as supervising physician I was immediately available for consultation/collaboration.   EKG Interpretation None       Nat Christen, MD 06/13/14 1349

## 2014-06-14 NOTE — Telephone Encounter (Signed)
Called patient, scheduled appointment for tomorrow, 06/15/14.

## 2014-06-14 NOTE — Telephone Encounter (Signed)
Make her an appointment  

## 2014-06-15 ENCOUNTER — Encounter: Payer: Self-pay | Admitting: Orthopedic Surgery

## 2014-06-15 ENCOUNTER — Ambulatory Visit (INDEPENDENT_AMBULATORY_CARE_PROVIDER_SITE_OTHER): Payer: PRIVATE HEALTH INSURANCE | Admitting: Orthopedic Surgery

## 2014-06-15 VITALS — BP 146/100 | Ht 68.0 in | Wt 185.0 lb

## 2014-06-15 DIAGNOSIS — S82401A Unspecified fracture of shaft of right fibula, initial encounter for closed fracture: Secondary | ICD-10-CM

## 2014-06-15 DIAGNOSIS — S82409A Unspecified fracture of shaft of unspecified fibula, initial encounter for closed fracture: Secondary | ICD-10-CM

## 2014-06-15 DIAGNOSIS — S82899A Other fracture of unspecified lower leg, initial encounter for closed fracture: Secondary | ICD-10-CM

## 2014-06-15 DIAGNOSIS — S82891A Other fracture of right lower leg, initial encounter for closed fracture: Secondary | ICD-10-CM

## 2014-06-15 MED ORDER — IBUPROFEN 600 MG PO TABS: 600.0000 mg | ORAL_TABLET | Freq: Four times a day (QID) | ORAL | Status: DC | PRN

## 2014-06-15 MED ORDER — OXYCODONE-ACETAMINOPHEN 5-325 MG PO TABS: 1.0000 | ORAL_TABLET | ORAL | Status: DC | PRN

## 2014-06-15 NOTE — Progress Notes (Signed)
Patient ID: [name], female   DOB: 1966/05/26, 48 y.o.   MRN: [id number] Chief Complaint  Patient presents with  . Ankle Injury    ER follow up Right ankle fracture d/t injury 06/10/14    48 year old female status post open fracture right distal tibia treated with external fixation internal fixation and 5 subsequent surgeries but with a very good result considering with a plantigrade foot inability to walk and no infection. However recently on the 25th of this month she fell again and has a nondisplaced fracture of her fibula in the right ankle. She complains of pain swelling giving way her pain is 10 and constant she takes ibuprofen and oxycodone is worse with pressure and ambulation. She was splinted at the time she was given crutches.  She has no medication allergies  Family history of hypertension and cancer  Family members mother and father are both alive. Siblings are alive 83, 65 and 78.  Medical history depression immune disorder arthritis  No other surgeries  Review of systems has been recorded reviewed and signed and scanned into the chart  BP 146/100  Ht 5\' 8"  (1.727 m)  Wt 185 lb (83.915 kg)  BMI 28.14 kg/m2 General appearance is normal, the patient is alert and oriented x3 with normal mood and affect. Vinnie Level with crutches partial to full weightbearing with pain. She has a multitude of scars over the distal ankle medially and laterally. There is fat necrosis and scarring as well. Her foot is plantigrade with knee flexion slightly plantarflexed extension. Tenderness over the fibula mild swelling in the foot. There is not a great deal of motion but she is in some tibialis anterior spasm today. Muscle tone is normal. Skin as stated. Distal pulses are intact. Gross sensation is normal.  X-rays show nondisplaced fibular fracture residual hardware and healing of the distal tibia with a fairly neutral joint.  Plan Cam Walker, she has one already, weight-bear as tolerated with  crutches, x-ray in 6 weeks  Meds ordered this encounter  Medications  . oxyCODONE-acetaminophen (PERCOCET/ROXICET) 5-325 MG per tablet    Sig: Take 1 tablet by mouth every 4 (four) hours as needed.    Dispense:  42 tablet    Refill:  0  . DISCONTD: ibuprofen (ADVIL,MOTRIN) 600 MG tablet    Sig: Take 1 tablet (600 mg total) by mouth every 6 (six) hours as needed.    Dispense:  60 tablet    Refill:  5  . ibuprofen (ADVIL,MOTRIN) 600 MG tablet    Sig: Take 1 tablet (600 mg total) by mouth every 6 (six) hours as needed.    Dispense:  60 tablet    Refill:  5

## 2014-06-15 NOTE — Patient Instructions (Signed)
Wear Boot for 6 weeks

## 2014-07-03 ENCOUNTER — Ambulatory Visit: Payer: Self-pay | Admitting: Gynecology

## 2014-07-27 ENCOUNTER — Ambulatory Visit (INDEPENDENT_AMBULATORY_CARE_PROVIDER_SITE_OTHER): Payer: Self-pay | Admitting: Orthopedic Surgery

## 2014-07-27 ENCOUNTER — Ambulatory Visit (INDEPENDENT_AMBULATORY_CARE_PROVIDER_SITE_OTHER): Payer: PRIVATE HEALTH INSURANCE

## 2014-07-27 ENCOUNTER — Encounter: Payer: Self-pay | Admitting: Orthopedic Surgery

## 2014-07-27 DIAGNOSIS — S82891A Other fracture of right lower leg, initial encounter for closed fracture: Secondary | ICD-10-CM

## 2014-07-27 DIAGNOSIS — S82899A Other fracture of unspecified lower leg, initial encounter for closed fracture: Secondary | ICD-10-CM

## 2014-07-27 MED ORDER — OXYCODONE-ACETAMINOPHEN 5-325 MG PO TABS: 1.0000 | ORAL_TABLET | ORAL | Status: DC | PRN

## 2014-07-27 MED ORDER — IBUPROFEN 600 MG PO TABS: 600.0000 mg | ORAL_TABLET | Freq: Four times a day (QID) | ORAL | Status: DC | PRN

## 2014-07-29 DIAGNOSIS — S82891A Other fracture of right lower leg, initial encounter for closed fracture: Secondary | ICD-10-CM | POA: Insufficient documentation

## 2014-07-29 NOTE — Progress Notes (Signed)
Fracture followup  Fibula fracture  48 year old female status post open fracture right distal tibia treated with external fixation internal fixation and 5 subsequent surgeries but with a very good result considering with a plantigrade foot inability to walk and no infection. However recently on the 25th of this month she fell again and has a nondisplaced fracture of her fibula in the right ankle. She complains of pain swelling giving way her pain is 10 and constant she takes ibuprofen and oxycodone is worse with pressure and ambulation. She was splinted at the time she was given crutches. Ankle Injury      ER follow up Right ankle fracture d/t injury 06/10/14   47 days post fibular fracture with underlying degenerative arthritis posttraumatic tibial fracture with multiple surgeries required for reconstruction.  She still complains of severe pain in its in the ankle joint not at the fibula. The x-ray of the fibula and clinical exam shows the tenderness is 95% gone and the fracture appears healed on x-ray  We discussed several things for her  #1 option for continue Cam Walker #2 double upright orthosis #3 AFO #4 referral to a ankle specialist via Dr. Ninfa Linden original treating surgeon  At this point we've opted for AFO or double upright orthosis depending on insurance if not continue Cam Walker for a period of 12 weeks post injury to see if this unload the ankle joint and release her ankle joint pain if not then we will have to send her to Dr. Ninfa Linden who can refer to Dr. Sharol Given perhaps for definitive care  In the short term are going to try to get the AFO/double upright orthosis which are 1 we can get approval through insurance in the meantime she'll wear the Cam Walker  Meds ordered this encounter  Medications  . oxyCODONE-acetaminophen (PERCOCET/ROXICET) 5-325 MG per tablet    Sig: Take 1 tablet by mouth every 4 (four) hours as needed.    Dispense:  42 tablet    Refill:  0  . ibuprofen  (ADVIL,MOTRIN) 600 MG tablet    Sig: Take 1 tablet (600 mg total) by mouth every 6 (six) hours as needed.    Dispense:  60 tablet    Refill:  5

## 2014-08-10 ENCOUNTER — Ambulatory Visit: Payer: Self-pay | Admitting: Gynecology

## 2014-09-07 ENCOUNTER — Encounter: Payer: Self-pay | Admitting: Orthopedic Surgery

## 2014-09-07 ENCOUNTER — Ambulatory Visit (INDEPENDENT_AMBULATORY_CARE_PROVIDER_SITE_OTHER): Payer: Self-pay | Admitting: Orthopedic Surgery

## 2014-09-07 VITALS — BP 148/97 | Ht 68.0 in | Wt 185.0 lb

## 2014-09-07 DIAGNOSIS — S82891A Other fracture of right lower leg, initial encounter for closed fracture: Secondary | ICD-10-CM

## 2014-09-07 DIAGNOSIS — M19079 Primary osteoarthritis, unspecified ankle and foot: Secondary | ICD-10-CM | POA: Insufficient documentation

## 2014-09-07 DIAGNOSIS — M129 Arthropathy, unspecified: Secondary | ICD-10-CM

## 2014-09-07 NOTE — Progress Notes (Signed)
Patient ID: KIMBERLI WINNE, female   DOB: 04/22/1966, 48 y.o.   MRN: [id number] Encounter Diagnoses  Name Primary?  Marland Kitchen Ankle fracture, right   . Arthritis of ankle Yes    Followup visit after new ankle fracture with underlying previous open treatment internal fixation of severe distal tibia fracture with multiple surgeries by Dr. Rush Farmer.  The patient has removed her brace at this point she is interested in getting the double upright ankle foot orthosis from [facility]  She still complains of whether related pain  In the past she got relief from OxyContin and oxycodone but stopped taking that when she was released by Dr. Rush Farmer paragraph she does not want to seek pain management at this point she did that once and they didn't give her any medication and she is not interested in going back at this time.  The plan as far as we're concerned as to get the double upright ankle orthosis and if she does not improve then she can see the foot and ankle specialist at Dr. Pryor Montes practice

## 2014-09-20 ENCOUNTER — Encounter: Payer: Self-pay | Admitting: Gynecology

## 2014-09-20 ENCOUNTER — Other Ambulatory Visit (HOSPITAL_COMMUNITY)
Admission: RE | Admit: 2014-09-20 | Discharge: 2014-09-20 | Disposition: A | Discharge status: Home / Self Care | Payer: PRIVATE HEALTH INSURANCE | Source: Ambulatory Visit | Attending: Gynecology | Admitting: Gynecology

## 2014-09-20 ENCOUNTER — Ambulatory Visit (INDEPENDENT_AMBULATORY_CARE_PROVIDER_SITE_OTHER): Payer: PRIVATE HEALTH INSURANCE | Admitting: Gynecology

## 2014-09-20 VITALS — BP 150/90 | Ht 66.0 in | Wt 203.0 lb

## 2014-09-20 DIAGNOSIS — N951 Menopausal and female climacteric states: Secondary | ICD-10-CM

## 2014-09-20 DIAGNOSIS — Z01411 Encounter for gynecological examination (general) (routine) with abnormal findings: Secondary | ICD-10-CM | POA: Diagnosis present

## 2014-09-20 DIAGNOSIS — Z1151 Encounter for screening for human papillomavirus (HPV): Secondary | ICD-10-CM | POA: Diagnosis present

## 2014-09-20 DIAGNOSIS — Z01419 Encounter for gynecological examination (general) (routine) without abnormal findings: Secondary | ICD-10-CM

## 2014-09-20 LAB — TSH: TSH: 2.384 u[IU]/mL (ref 0.350–4.500)

## 2014-09-20 MED ORDER — ESTRADIOL 0.05 MG/24HR TD PTTW: 1.0000 | MEDICATED_PATCH | TRANSDERMAL | Status: DC

## 2014-09-20 NOTE — Progress Notes (Signed)
[name] Aug 29, 1966 [id number]        48 y.o.  G2P2 for annual exam.  Has not been the office for a number of years. Several issues noted below.  Past medical history,surgical history, problem list, medications, allergies, family history and social history were all reviewed and documented as reviewed in the EPIC chart.  ROS:  12 system ROS performed with pertinent positives and negatives included in the history, assessment and plan.   Additional significant findings :  none   Exam: Kim Counsellor Vitals:   09/20/14 1045  BP: 150/90  Height: 5\' 6"  (1.676 m)  Weight: 203 lb (92.08 kg)   General appearance:  Normal affect, orientation and appearance. Skin: Grossly normal HEENT: Without gross lesions.  No cervical or supraclavicular adenopathy. Thyroid normal.  Lungs:  Clear without wheezing, rales or rhonchi Cardiac: RR, without RMG Abdominal:  Soft, nontender, without masses, guarding, rebound, organomegaly or hernia Breasts:  Examined lying and sitting without masses, retractions, discharge or axillary adenopathy. Pelvic:  Ext/BUS/vagina normal. Pap of cuff done  Adnexa  Without masses or tenderness    Anus and perineum  Normal   Rectovaginal  Normal sphincter tone without palpated masses or tenderness.    Assessment/Plan:  48 y.o. G2P2 female for annual exam.   1. Menopausal symptoms. Patient having significant hot flushes, night sweats over the past year. Tried OTC products without success. Status post hysterectomy for endometriosis 2003. No significant vaginal dryness or dyspareunia. Reviewed options to include OTC products, ERT. Is already on fluoxetine and Wellbutrin.  I reviewed the whole issue of HRT with her to include the WHI study with increased risk of stroke, heart attack, DVT and breast cancer. The ACOG and NAMS statements for lowest dose for the shortest period of time reviewed. Transdermal versus oral first-pass effect benefit discussed.  For high blood  pressure was also reviewed with her. Continue beating factors far as stroke heart attack DVT. After lengthy discussion the patient does want to try transdermal estrogen. Vivelle 0.05 mg patches prescribed. Refill 1 year. Call me if unsatisfactory relief or any other issues.  FSH/TSH. 2. Hypertension. Blood pressure 150/90.  She is actively being followed by Dr. Redmond Pulling. I recommended she follow up with him and consider treatment if she continues to run at a higher blood pressure. 3. Pap smear a number of years ago. Pap of vaginal cuff today. No history of significant abnormal Pap smears. Options to stop screening altogether she is status post hysterectomy for benign indications versus less frequent screening interval reviewed. Will readdress on an annual basis. 4. Mammography 12/2012. Patient knows she is overdue and agrees to schedule. SBE monthly reviewed. 5. Health maintenance. No routine blood work done as she reports this is done at Dr. Dois Davenport office. Follow up in one year, sooner as needed.     Anastasio Auerbach MD, 11:13 AM 09/20/2014

## 2014-09-20 NOTE — Patient Instructions (Signed)
Start on the estrogen patches as we discussed. Call me if you have any issues with this. Follow up with her primary physician in reference to your high blood pressure.  You may obtain a copy of any labs that were done today by logging onto MyChart as outlined in the instructions provided with your AVS (after visit summary). The office will not call with normal lab results but certainly if there are any significant abnormalities then we will contact you.   Health Maintenance, Female A healthy lifestyle and preventative care can promote health and wellness.  Maintain regular health, dental, and eye exams.  Eat a healthy diet. Foods like vegetables, fruits, whole grains, low-fat dairy products, and lean protein foods contain the nutrients you need without too many calories. Decrease your intake of foods high in solid fats, added sugars, and salt. Get information about a proper diet from your caregiver, if necessary.  Regular physical exercise is one of the most important things you can do for your health. Most adults should get at least 150 minutes of moderate-intensity exercise (any activity that increases your heart rate and causes you to sweat) each week. In addition, most adults need muscle-strengthening exercises on 2 or more days a week.   Maintain a healthy weight. The body mass index (BMI) is a screening tool to identify possible weight problems. It provides an estimate of body fat based on height and weight. Your caregiver can help determine your BMI, and can help you achieve or maintain a healthy weight. For adults 20 years and older:  A BMI below 18.5 is considered underweight.  A BMI of 18.5 to 24.9 is normal.  A BMI of 25 to 29.9 is considered overweight.  A BMI of 30 and above is considered obese.  Maintain normal blood lipids and cholesterol by exercising and minimizing your intake of saturated fat. Eat a balanced diet with plenty of fruits and vegetables. Blood tests for lipids  and cholesterol should begin at age 67 and be repeated every 5 years. If your lipid or cholesterol levels are high, you are over 50, or you are a high risk for heart disease, you may need your cholesterol levels checked more frequently.Ongoing high lipid and cholesterol levels should be treated with medicines if diet and exercise are not effective.  If you smoke, find out from your caregiver how to quit. If you do not use tobacco, do not start.  Lung cancer screening is recommended for adults aged 69 80 years who are at high risk for developing lung cancer because of a history of smoking. Yearly low-dose computed tomography (CT) is recommended for people who have at least a 30-pack-year history of smoking and are a current smoker or have quit within the past 15 years. A pack year of smoking is smoking an average of 1 pack of cigarettes a day for 1 year (for example: 1 pack a day for 30 years or 2 packs a day for 15 years). Yearly screening should continue until the smoker has stopped smoking for at least 15 years. Yearly screening should also be stopped for people who develop a health problem that would prevent them from having lung cancer treatment.  If you are pregnant, do not drink alcohol. If you are breastfeeding, be very cautious about drinking alcohol. If you are not pregnant and choose to drink alcohol, do not exceed 1 drink per day. One drink is considered to be 12 ounces (355 mL) of beer, 5 ounces (148 mL) of  wine, or 1.5 ounces (44 mL) of liquor.  Avoid use of street drugs. Do not share needles with anyone. Ask for help if you need support or instructions about stopping the use of drugs.  High blood pressure causes heart disease and increases the risk of stroke. Blood pressure should be checked at least every 1 to 2 years. Ongoing high blood pressure should be treated with medicines, if weight loss and exercise are not effective.  If you are 13 to 49 years old, ask your caregiver if you  should take aspirin to prevent strokes.  Diabetes screening involves taking a blood sample to check your fasting blood sugar level. This should be done once every 3 years, after age 10, if you are within normal weight and without risk factors for diabetes. Testing should be considered at a younger age or be carried out more frequently if you are overweight and have at least 1 risk factor for diabetes.  Breast cancer screening is essential preventative care for women. You should practice "breast self-awareness." This means understanding the normal appearance and feel of your breasts and may include breast self-examination. Any changes detected, no matter how small, should be reported to a caregiver. Women in their 47s and 30s should have a clinical breast exam (CBE) by a caregiver as part of a regular health exam every 1 to 3 years. After age 29, women should have a CBE every year. Starting at age 26, women should consider having a mammogram (breast X-ray) every year. Women who have a family history of breast cancer should talk to their caregiver about genetic screening. Women at a high risk of breast cancer should talk to their caregiver about having an MRI and a mammogram every year.  Breast cancer gene (BRCA)-related cancer risk assessment is recommended for women who have family members with BRCA-related cancers. BRCA-related cancers include breast, ovarian, tubal, and peritoneal cancers. Having family members with these cancers may be associated with an increased risk for harmful changes (mutations) in the breast cancer genes BRCA1 and BRCA2. Results of the assessment will determine the need for genetic counseling and BRCA1 and BRCA2 testing.  The Pap test is a screening test for cervical cancer. Women should have a Pap test starting at age 23. Between ages 43 and 80, Pap tests should be repeated every 2 years. Beginning at age 54, you should have a Pap test every 3 years as long as the past 3 Pap tests  have been normal. If you had a hysterectomy for a problem that was not cancer or a condition that could lead to cancer, then you no longer need Pap tests. If you are between ages 107 and 61, and you have had normal Pap tests going back 10 years, you no longer need Pap tests. If you have had past treatment for cervical cancer or a condition that could lead to cancer, you need Pap tests and screening for cancer for at least 20 years after your treatment. If Pap tests have been discontinued, risk factors (such as a new sexual partner) need to be reassessed to determine if screening should be resumed. Some women have medical problems that increase the chance of getting cervical cancer. In these cases, your caregiver may recommend more frequent screening and Pap tests.  The human papillomavirus (HPV) test is an additional test that may be used for cervical cancer screening. The HPV test looks for the virus that can cause the cell changes on the cervix. The cells collected during  the Pap test can be tested for HPV. The HPV test could be used to screen women aged 51 years and older, and should be used in women of any age who have unclear Pap test results. After the age of 86, women should have HPV testing at the same frequency as a Pap test.  Colorectal cancer can be detected and often prevented. Most routine colorectal cancer screening begins at the age of 49 and continues through age 40. However, your caregiver may recommend screening at an earlier age if you have risk factors for colon cancer. On a yearly basis, your caregiver may provide home test kits to check for hidden blood in the stool. Use of a small camera at the end of a tube, to directly examine the colon (sigmoidoscopy or colonoscopy), can detect the earliest forms of colorectal cancer. Talk to your caregiver about this at age 76, when routine screening begins. Direct examination of the colon should be repeated every 5 to 10 years through age 59, unless  early forms of pre-cancerous polyps or small growths are found.  Hepatitis C blood testing is recommended for all people born from [birth year] through 1966 and any individual with known risks for hepatitis C.  Practice safe sex. Use condoms and avoid high-risk sexual practices to reduce the spread of sexually transmitted infections (STIs). Sexually active women aged 73 and younger should be checked for Chlamydia, which is a common sexually transmitted infection. Older women with new or multiple partners should also be tested for Chlamydia. Testing for other STIs is recommended if you are sexually active and at increased risk.  Osteoporosis is a disease in which the bones lose minerals and strength with aging. This can result in serious bone fractures. The risk of osteoporosis can be identified using a bone density scan. Women ages 66 and over and women at risk for fractures or osteoporosis should discuss screening with their caregivers. Ask your caregiver whether you should be taking a calcium supplement or vitamin D to reduce the rate of osteoporosis.  Menopause can be associated with physical symptoms and risks. Hormone replacement therapy is available to decrease symptoms and risks. You should talk to your caregiver about whether hormone replacement therapy is right for you.  Use sunscreen. Apply sunscreen liberally and repeatedly throughout the day. You should seek shade when your shadow is shorter than you. Protect yourself by wearing long sleeves, pants, a wide-brimmed hat, and sunglasses year round, whenever you are outdoors.  Notify your caregiver of new moles or changes in moles, especially if there is a change in shape or color. Also notify your caregiver if a mole is larger than the size of a pencil eraser.  Stay current with your immunizations. Document Released: 05/19/2011 Document Revised: 02/28/2013 Document Reviewed: 05/19/2011 [facility] Patient Information 2014 [place].

## 2014-09-20 NOTE — Addendum Note (Signed)
Addended by: Nelva Nay on: 09/20/2014 11:33 AM   Modules accepted: Orders, SmartSet

## 2014-09-21 LAB — FOLLICLE STIMULATING HORMONE: FSH: 4.9 m[IU]/mL

## 2014-09-21 LAB — CYTOLOGY - PAP

## 2014-12-15 ENCOUNTER — Other Ambulatory Visit: Payer: Self-pay

## 2014-12-15 MED ORDER — ESTRADIOL 0.05 MG/24HR TD PTTW: 1.0000 | MEDICATED_PATCH | TRANSDERMAL | Status: DC

## 2014-12-15 NOTE — Telephone Encounter (Signed)
Pharmacy requested 90 day supply per patient's ins plan.

## 2015-03-21 ENCOUNTER — Emergency Department (HOSPITAL_COMMUNITY): Payer: 59

## 2015-03-21 ENCOUNTER — Emergency Department (HOSPITAL_COMMUNITY)
Admission: EM | Admit: 2015-03-21 | Discharge: 2015-03-21 | Disposition: A | Discharge status: Home / Self Care | Payer: 59 | Attending: Emergency Medicine | Admitting: Emergency Medicine

## 2015-03-21 ENCOUNTER — Encounter (HOSPITAL_COMMUNITY): Payer: Self-pay | Admitting: Emergency Medicine

## 2015-03-21 DIAGNOSIS — Z87828 Personal history of other (healed) physical injury and trauma: Secondary | ICD-10-CM | POA: Insufficient documentation

## 2015-03-21 DIAGNOSIS — F419 Anxiety disorder, unspecified: Secondary | ICD-10-CM | POA: Diagnosis not present

## 2015-03-21 DIAGNOSIS — R609 Edema, unspecified: Secondary | ICD-10-CM

## 2015-03-21 DIAGNOSIS — Z79899 Other long term (current) drug therapy: Secondary | ICD-10-CM | POA: Insufficient documentation

## 2015-03-21 DIAGNOSIS — R0602 Shortness of breath: Secondary | ICD-10-CM | POA: Insufficient documentation

## 2015-03-21 DIAGNOSIS — R079 Chest pain, unspecified: Secondary | ICD-10-CM | POA: Diagnosis present

## 2015-03-21 DIAGNOSIS — R0789 Other chest pain: Secondary | ICD-10-CM | POA: Diagnosis not present

## 2015-03-21 DIAGNOSIS — Z87891 Personal history of nicotine dependence: Secondary | ICD-10-CM | POA: Diagnosis not present

## 2015-03-21 DIAGNOSIS — Z8639 Personal history of other endocrine, nutritional and metabolic disease: Secondary | ICD-10-CM | POA: Insufficient documentation

## 2015-03-21 LAB — COMPREHENSIVE METABOLIC PANEL
ALBUMIN: 3.9 g/dL (ref 3.5–5.0)
ALK PHOS: 122 U/L (ref 38–126)
ALT: 78 U/L — ABNORMAL HIGH (ref 14–54)
ANION GAP: 15 (ref 5–15)
AST: 73 U/L — AB (ref 15–41)
BUN: 10 mg/dL (ref 6–20)
CO2: 23 mmol/L (ref 22–32)
Calcium: 9.4 mg/dL (ref 8.9–10.3)
Chloride: 98 mmol/L — ABNORMAL LOW (ref 101–111)
Creatinine, Ser: 0.84 mg/dL (ref 0.44–1.00)
GFR calc Af Amer: >60 mL/min (ref >60)
GFR calc non Af Amer: >60 mL/min (ref >60)
Glucose, Bld: 111 mg/dL — ABNORMAL HIGH (ref 70–99)
POTASSIUM: 3.3 mmol/L — AB (ref 3.5–5.1)
Sodium: 136 mmol/L (ref 135–145)
TOTAL PROTEIN: 7.7 g/dL (ref 6.5–8.1)
Total Bilirubin: 0.7 mg/dL (ref 0.3–1.2)

## 2015-03-21 LAB — URINALYSIS, ROUTINE W REFLEX MICROSCOPIC
Bilirubin Urine: NEGATIVE
Glucose, UA: NEGATIVE mg/dL
Hgb urine dipstick: NEGATIVE
Ketones, ur: 15 mg/dL — AB
NITRITE: POSITIVE — AB
Protein, ur: NEGATIVE mg/dL
SPECIFIC GRAVITY, URINE: 1.027 (ref 1.005–1.030)
Urobilinogen, UA: 0.2 mg/dL (ref 0.0–1.0)
pH: 5.5 (ref 5.0–8.0)

## 2015-03-21 LAB — BASIC METABOLIC PANEL
Anion gap: 14 (ref 5–15)
BUN: 10 mg/dL (ref 6–20)
CALCIUM: 9.5 mg/dL (ref 8.9–10.3)
CO2: 24 mmol/L (ref 22–32)
Chloride: 99 mmol/L — ABNORMAL LOW (ref 101–111)
Creatinine, Ser: 0.86 mg/dL (ref 0.44–1.00)
GFR calc Af Amer: >60 mL/min (ref >60)
GLUCOSE: 118 mg/dL — AB (ref 70–99)
Potassium: 3.6 mmol/L (ref 3.5–5.1)
SODIUM: 137 mmol/L (ref 135–145)

## 2015-03-21 LAB — URINE MICROSCOPIC-ADD ON

## 2015-03-21 LAB — I-STAT TROPONIN, ED: TROPONIN I, POC: 0.01 ng/mL (ref 0.00–0.08)

## 2015-03-21 LAB — CBC
HEMATOCRIT: 39.7 % (ref 36.0–46.0)
Hemoglobin: 13.1 g/dL (ref 12.0–15.0)
MCH: 31.1 pg (ref 26.0–34.0)
MCHC: 33 g/dL (ref 30.0–36.0)
MCV: 94.3 fL (ref 78.0–100.0)
Platelets: 199 10*3/uL (ref 150–400)
RBC: 4.21 MIL/uL (ref 3.87–5.11)
RDW: 13.7 % (ref 11.5–15.5)
WBC: 7.6 10*3/uL (ref 4.0–10.5)

## 2015-03-21 LAB — BRAIN NATRIURETIC PEPTIDE: B Natriuretic Peptide: 17.9 pg/mL (ref 0.0–100.0)

## 2015-03-21 MED ORDER — FUROSEMIDE 20 MG PO TABS
40.0000 mg | ORAL_TABLET | Freq: Once | ORAL | Status: AC
  Administered 2015-03-21: 40 mg via ORAL
  Filled 2015-03-21: qty 2

## 2015-03-21 MED ORDER — POTASSIUM CHLORIDE CRYS ER 20 MEQ PO TBCR
40.0000 meq | EXTENDED_RELEASE_TABLET | Freq: Once | ORAL | Status: AC
  Administered 2015-03-21: 40 meq via ORAL
  Filled 2015-03-21: qty 2

## 2015-03-21 MED ORDER — OXYCODONE-ACETAMINOPHEN 5-325 MG PO TABS
2.0000 | ORAL_TABLET | Freq: Once | ORAL | Status: AC
  Administered 2015-03-21: 2 via ORAL
  Filled 2015-03-21: qty 2

## 2015-03-21 NOTE — ED Notes (Signed)
Pt had sneezing spell 1 week ago and "pulled" back muscle. Pain has been migrating around Right side to R arm and chest. Pt also saw PA yesterday and received Rx for Lasix and potassium for BLE edema.

## 2015-03-21 NOTE — Discharge Instructions (Signed)
Chest Wall Pain Chest wall pain is pain in or around the bones and muscles of your chest. It may take up to 6 weeks to get better. It may take longer if you must stay physically active in your work and activities.  CAUSES  Chest wall pain may happen on its own. However, it may be caused by:  A viral illness like the flu.  Injury.  Coughing.  Exercise.  Arthritis.  Fibromyalgia.  Shingles. HOME CARE INSTRUCTIONS   Avoid overtiring physical activity. Try not to strain or perform activities that cause pain. This includes any activities using your chest or your abdominal and side muscles, especially if heavy weights are used.  Put ice on the sore area.  Put ice in a plastic bag.  Place a towel between your skin and the bag.  Leave the ice on for 15-20 minutes per hour while awake for the first 2 days.  Only take over-the-counter or prescription medicines for pain, discomfort, or fever as directed by your caregiver. SEEK IMMEDIATE MEDICAL CARE IF:   Your pain increases, or you are very uncomfortable.  You have a fever.  Your chest pain becomes worse.  You have new, unexplained symptoms.  You have nausea or vomiting.  You feel sweaty or lightheaded.  You have a cough with phlegm (sputum), or you cough up blood. MAKE SURE YOU:   Understand these instructions.  Will watch your condition.  Will get help right away if you are not doing well or get worse. Document Released: 11/03/2005 Document Revised: 01/26/2012 Document Reviewed: 06/30/2011 [facility] Patient Information 2015 [place], [place]. This information is not intended to replace advice given to you by your health care provider. Make sure you discuss any questions you have with your health care provider.  Edema Edema is an abnormal buildup of fluids. It is more common in your legs and thighs. Painless swelling of the feet and ankles is more likely as a person ages. It also is common in looser skin, like around your  eyes. HOME CARE   Keep the affected body part above the level of the heart while lying down.  Do not sit still or stand for a long time.  Do not put anything right under your knees when you lie down.  Do not wear tight clothes on your upper legs.  Exercise your legs to help the puffiness (swelling) go down.  Wear elastic bandages or support stockings as told by your doctor.  A low-salt diet may help lessen the puffiness.  Only take medicine as told by your doctor. GET HELP IF:  Treatment is not working.  You have heart, liver, or kidney disease and notice that your skin looks puffy or shiny.  You have puffiness in your legs that does not get better when you raise your legs.  You have sudden weight gain for no reason. GET HELP RIGHT AWAY IF:   You have shortness of breath or chest pain.  You cannot breathe when you lie down.  You have pain, redness, or warmth in the areas that are puffy.  You have heart, liver, or kidney disease and get edema all of a sudden.  You have a fever and your symptoms get worse all of a sudden. MAKE SURE YOU:   Understand these instructions.  Will watch your condition.  Will get help right away if you are not doing well or get worse. Document Released: 04/21/2008 Document Revised: 11/08/2013 Document Reviewed: 08/26/2013 [facility] Patient Information 2015 [place], [place].  This information is not intended to replace advice given to you by your health care provider. Make sure you discuss any questions you have with your health care provider. ° °

## 2015-03-21 NOTE — ED Provider Notes (Signed)
CSN: [id number]     Arrival date & time 03/21/15  7341 History   First MD Initiated Contact with Patient 03/21/15 [phone]     Chief Complaint  Patient presents with  . Chest Pain  . Leg Swelling      HPI  Patient presents for evaluation of right chest pain, and leg swelling. She states that several days ago she had a coughing spell at home. She had sudden severe right chest pain. States it" Prometrium the ground". She states that she had a lot of trouble even moving for the next few hours it was so sharp and painful. It is persisted since that time. Over the last 48 hours she states she's developed swelling in her feet. Seems to be symmetric to both lower legs and feet. They're not painful. Has never had a documented DVT. His never had peripheral edema congestive heart failure liver or kidney disease. Denies taking excessive anti-inflammatories. No left-sided chest pain or pressure. No exertional symptoms.  Past Medical History  Diagnosis Date  . Arthritis   . Thyroid disease   . Broken ankle    Past Surgical History  Procedure Laterality Date  . Fracture surgery    . Neuro stimulator implant    . Abdominal hysterectomy  1993    endometriosis with pain   Family History  Problem Relation Age of Onset  . Cancer Father     Brain tumor   History  Substance Use Topics  . Smoking status: Former Smoker    Quit date: 06/24/2011  . Smokeless tobacco: Not on file  . Alcohol Use: 3.0 oz/week    5 Standard drinks or equivalent per week   OB History    Gravida Para Term Preterm AB TAB SAB Ectopic Multiple Living   2 2        2      Review of Systems  Constitutional: Negative for fever, chills, diaphoresis, appetite change and fatigue.  HENT: Negative for mouth sores, sore throat and trouble swallowing.   Eyes: Negative for visual disturbance.  Respiratory: Positive for shortness of breath. Negative for cough, chest tightness and wheezing.   Cardiovascular: Positive for chest pain and  leg swelling.  Gastrointestinal: Negative for nausea, vomiting, abdominal pain, diarrhea and abdominal distention.  Endocrine: Negative for polydipsia, polyphagia and polyuria.  Genitourinary: Negative for dysuria, frequency and hematuria.  Musculoskeletal: Negative for gait problem.  Skin: Negative for color change, pallor and rash.  Neurological: Negative for dizziness, syncope, light-headedness and headaches.  Hematological: Does not bruise/bleed easily.  Psychiatric/Behavioral: Negative for behavioral problems and confusion.      Allergies  Review of patient's allergies indicates no known allergies.  Home Medications   Prior to Admission medications   Medication Sig Start Date End Date Taking? Authorizing Provider  ALPRAZolam Duanne Moron) 0.5 MG tablet Take 0.5 mg by mouth daily as needed. For anxiety 05/11/14  Yes Historical Provider, MD  BIOTIN PO Take 1 tablet by mouth daily.   Yes Historical Provider, MD  buPROPion (WELLBUTRIN XL) 300 MG 24 hr tablet Take 300 mg by mouth every morning. 06/05/14  Yes Historical Provider, MD  estradiol (VIVELLE-DOT) 0.05 MG/24HR patch Place 1 patch (0.05 mg total) onto the skin 2 (two) times a week. 12/15/14  Yes Anastasio Auerbach, MD  FLUoxetine (PROZAC) 40 MG capsule Take 40 mg by mouth 2 (two) times daily.   Yes Historical Provider, MD  furosemide (LASIX) 40 MG tablet Take 40 mg by mouth daily. 03/20/15  Yes  Historical Provider, MD  ibuprofen (ADVIL,MOTRIN) 600 MG tablet Take 1 tablet (600 mg total) by mouth every 6 (six) hours as needed. 07/27/14  Yes Carole Civil, MD  omeprazole (PRILOSEC) 20 MG capsule Take 20 mg by mouth daily.   Yes Historical Provider, MD  oxyCODONE (ROXICODONE) 15 MG immediate release tablet Take 15 mg by mouth every 4 (four) hours as needed. 03/20/15  Yes Historical Provider, MD  potassium chloride (MICRO-K) 10 MEQ CR capsule Take 10 mEq by mouth daily. 03/20/15  Yes Historical Provider, MD  QUEtiapine (SEROQUEL) 100 MG tablet  Take 100 mg by mouth daily. 12/25/14  Yes Historical Provider, MD  traMADol (ULTRAM) 50 MG tablet Take 50 mg by mouth every 6 (six) hours as needed. pain 03/19/15  Yes Historical Provider, MD   BP 139/69 mmHg  Pulse 74  Temp(Src) 97.7 F (36.5 C) (Oral)  Resp 19  Ht 5\' 5"  (1.651 m)  Wt 230 lb (104.327 kg)  BMI 38.27 kg/m2  SpO2 96% Physical Exam  Constitutional: She is oriented to person, place, and time. She appears well-developed and well-nourished. No distress.  HENT:  Head: Normocephalic.  Eyes: Conjunctivae are normal. Pupils are equal, round, and reactive to light. No scleral icterus.  Neck: Normal range of motion. Neck supple. No thyromegaly present.  Cardiovascular: Normal rate and regular rhythm.  Exam reveals no gallop and no friction rub.   No murmur heard. Pulmonary/Chest: Effort normal and breath sounds normal. No respiratory distress. She has no wheezes. She has no rales.      Clear Bilateral breath sounds.  No crackles or rales. No S3 or S4.   Abdominal: Soft. Bowel sounds are normal. She exhibits no distension. There is no tenderness. There is no rebound.  Musculoskeletal: Normal range of motion.  Neurological: She is alert and oriented to person, place, and time.  Skin: Skin is warm and dry. No rash noted.     Psychiatric: She has a normal mood and affect. Her behavior is normal.    ED Course  Procedures (including critical care time) Labs Review Labs Reviewed  BASIC METABOLIC PANEL - Abnormal; Notable for the following:    Chloride 99 (*)    Glucose, Bld 118 (*)    All other components within normal limits  COMPREHENSIVE METABOLIC PANEL - Abnormal; Notable for the following:    Potassium 3.3 (*)    Chloride 98 (*)    Glucose, Bld 111 (*)    AST 73 (*)    ALT 78 (*)    All other components within normal limits  URINALYSIS, ROUTINE W REFLEX MICROSCOPIC - Abnormal; Notable for the following:    APPearance CLOUDY (*)    Ketones, ur 15 (*)    Nitrite  POSITIVE (*)    Leukocytes, UA SMALL (*)    All other components within normal limits  URINE MICROSCOPIC-ADD ON - Abnormal; Notable for the following:    Squamous Epithelial / LPF FEW (*)    Bacteria, UA MANY (*)    All other components within normal limits  CBC  BRAIN NATRIURETIC PEPTIDE  I-STAT TROPOININ, ED    Imaging Review Dg Chest Port 1 View  03/21/2015   CLINICAL DATA:  49 year old female with chest pain shortness of breath and lower extremity swelling this morning. Initial encounter.  EXAM: PORTABLE CHEST - 1 VIEW  COMPARISON:  Chest radiographs and CTA 08/31/2008, and earlier  FINDINGS: Portable AP semi upright view at 0731 hours. Body habitus appears increased from  the prior exams. Lower lung volumes. Stable cardiac size and mediastinal contours. Visualized tracheal air column is within normal limits. No pneumothorax. No pleural effusion or consolidation. Pulmonary vascular congestion without overt edema. A lower thoracic spinal stimulator device is in place and new from prior exams.  IMPRESSION: No acute cardiopulmonary abnormality.   Electronically Signed   By: Genevie Ann M.D.   On: 03/21/2015 07:40     EKG Interpretation   Date/Time:  Wednesday Mar 21 2015 07:10:43 EDT Ventricular Rate:  87 PR Interval:  118 QRS Duration: 83 QT Interval:  398 QTC Calculation: 479 R Axis:   59 Text Interpretation:  Ectopic atrial rhythm Borderline short PR interval  Low voltage, precordial leads Confirmed by Jeneen Rinks  MD, Clemons ([id number]) on  03/21/2015 7:14:10 AM      MDM   Final diagnoses:  Chest wall pain  Dependent edema     Clinically the patient's pain is pleuritic, positional, and reproducible. Chest x-ray suggests possible vascular congestion. Is not hypertensive. Has normal BNP. Does not have other surrogate signs of congestive heart failure. No JVD, no gallop rhythm on exam.  Normal troponin. EKG with a neck type atrial rhythm, however not tachycardic. Normal BUN and creatinine. No  protein in urine.  Not taking excessive NSAIDs.  Plan this point will be symptomatic treatment for probable chest wall injury and possible occult rib fracture. I discussed this with her at length. I agree with diuresis and close primary care follow-up for improvement.  Tanna Furry, MD 03/21/15 1027

## 2015-03-28 ENCOUNTER — Encounter: Payer: Self-pay | Admitting: Gynecology

## 2015-03-28 ENCOUNTER — Other Ambulatory Visit (HOSPITAL_COMMUNITY)
Admission: RE | Admit: 2015-03-28 | Discharge: 2015-03-28 | Disposition: A | Discharge status: Home / Self Care | Payer: 59 | Source: Ambulatory Visit | Attending: Gynecology | Admitting: Gynecology

## 2015-03-28 ENCOUNTER — Ambulatory Visit (INDEPENDENT_AMBULATORY_CARE_PROVIDER_SITE_OTHER): Payer: PRIVATE HEALTH INSURANCE | Admitting: Gynecology

## 2015-03-28 VITALS — BP 124/78

## 2015-03-28 DIAGNOSIS — Z01411 Encounter for gynecological examination (general) (routine) with abnormal findings: Secondary | ICD-10-CM | POA: Diagnosis present

## 2015-03-28 DIAGNOSIS — R896 Abnormal cytological findings in specimens from other organs, systems and tissues: Secondary | ICD-10-CM

## 2015-03-28 DIAGNOSIS — N3000 Acute cystitis without hematuria: Secondary | ICD-10-CM | POA: Diagnosis not present

## 2015-03-28 DIAGNOSIS — IMO0002 Reserved for concepts with insufficient information to code with codable children: Secondary | ICD-10-CM

## 2015-03-28 MED ORDER — NITROFURANTOIN MONOHYD MACRO 100 MG PO CAPS: 100.0000 mg | ORAL_CAPSULE | Freq: Two times a day (BID) | ORAL | Status: DC

## 2015-03-28 NOTE — Addendum Note (Signed)
Addended by: Nelva Nay on: 03/28/2015 11:55 AM   Modules accepted: Orders

## 2015-03-28 NOTE — Patient Instructions (Signed)
Follow up with Dr. Redmond Pulling in reference to your blood work from the emergency room. Your potassium level was low, your glucose level was high and you did have some mild elevations in her liver functions. He will definitely need to follow up on this.  Take the oral antibiotic twice daily for 7 days.

## 2015-03-28 NOTE — Progress Notes (Signed)
[name] [birth year] [id number]        49 y.o.  G2P2 Presents for follow up Pap smear.  She had her annual exam 09/2014 having not had a Pap smear in a long time and it showed ASCUS negative high risk HPV. I asked her to return in 6 months for repeat Pap smear. She was recently seen in the emergency room for a broken rib. Review of her lab work showed a lower potassium, higher glucose and elevated LFTs. She is on extra potassium now. They did not talk about the LFTs to her. She also had a urinalysis suspicious for UTI which has not been treated by her history.  She does note some mild lower abdominal discomfort. No frequency dysuria urgency fever chills nausea vomiting diarrhea constipation.  Past medical history,surgical history, problem list, medications, allergies, family history and social history were all reviewed and documented in the EPIC chart.  Directed ROS with pertinent positives and negatives documented in the history of present illness/assessment and plan.  Exam: Kim assistant Filed Vitals:   03/28/15 1106  BP: 124/78   General appearance:  Normal No CVA tenderness. Abdomen soft nontender without masses guarding rebound. Pelvic external BUS vagina normal. Pap smear of cuff done. Bimanual without masses or tenderness.  Assessment/Plan:  49 y.o. G2P2 with:  1. Ascus Pap smear negative high risk HPV. Pap smear repeated today. Assuming negative them plan follow up Pap smear at her next annual exam. 2. Urinalysis suggestive of UTI with mild lower abdominal discomfort. Will cover with Macrobid 100 mg twice a day 7 days. Follow up if her symptoms persist or worsen. 3. Recommended she follow up with Dr. Redmond Pulling in reference to her potassium, glucose and LFTs. The need to have these repeated was discussed and stressed with her and she agrees to make that arrangement.    Anastasio Auerbach MD, 11:40 AM 03/28/2015

## 2015-03-30 LAB — CYTOLOGY - PAP

## 2015-04-02 ENCOUNTER — Encounter: Payer: Self-pay | Admitting: Gynecology

## 2015-05-08 ENCOUNTER — Ambulatory Visit (INDEPENDENT_AMBULATORY_CARE_PROVIDER_SITE_OTHER): Payer: 59 | Admitting: Gynecology

## 2015-05-08 ENCOUNTER — Encounter: Payer: Self-pay | Admitting: Gynecology

## 2015-05-08 VITALS — BP 134/84

## 2015-05-08 DIAGNOSIS — R896 Abnormal cytological findings in specimens from other organs, systems and tissues: Secondary | ICD-10-CM

## 2015-05-08 DIAGNOSIS — IMO0002 Reserved for concepts with insufficient information to code with codable children: Secondary | ICD-10-CM

## 2015-05-08 DIAGNOSIS — R8290 Unspecified abnormal findings in urine: Secondary | ICD-10-CM

## 2015-05-08 LAB — URINALYSIS W MICROSCOPIC + REFLEX CULTURE
Bilirubin Urine: NEGATIVE
CRYSTALS: NONE SEEN
Casts: NONE SEEN
Glucose, UA: NEGATIVE mg/dL
Hgb urine dipstick: NEGATIVE
Ketones, ur: NEGATIVE mg/dL
Leukocytes, UA: NEGATIVE
NITRITE: NEGATIVE
Protein, ur: NEGATIVE mg/dL
RBC / HPF: NONE SEEN RBC/hpf (ref <3)
Urobilinogen, UA: 0.2 mg/dL (ref 0.0–1.0)
WBC, UA: NONE SEEN WBC/hpf (ref <3)
pH: 6 (ref 5.0–8.0)

## 2015-05-08 LAB — WET PREP FOR TRICH, YEAST, CLUE
Trich, Wet Prep: NONE SEEN
YEAST WET PREP: NONE SEEN

## 2015-05-08 MED ORDER — CLINDAMYCIN PHOSPHATE 2 % VA CREA: 1.0000 | TOPICAL_CREAM | Freq: Every day | VAGINAL | Status: DC

## 2015-05-08 NOTE — Addendum Note (Signed)
Addended by: Anastasio Auerbach on: 05/08/2015 03:29 PM   Modules accepted: Orders

## 2015-05-08 NOTE — Patient Instructions (Signed)
Use the vaginal cream nightly for 7 nights. Follow up if symptoms persist, worsen or recur.  Follow up late fall for your annual exam when we will repeat your Pap smear.

## 2015-05-08 NOTE — Progress Notes (Signed)
[name] 09/21/1966 [id number]        49 y.o.  G2P2 Presents for colposcopy. History of first abnormal Pap smear November 2015 during her annual exam showing ASCUS negative high-risk HPV. Follow up Pap smear 6 months later in May showed ASCUS. Patient was asked to come in for colposcopy due to persistent ASCUS. Also notes of last several weeks a odor to her urine. No significant discharge itching or irritation. No fever or chills frequency dysuria or urgency.  History of TAH for endometriosis and pain 1993.  Past medical history,surgical history, problem list, medications, allergies, family history and social history were all reviewed and documented in the EPIC chart.  Directed ROS with pertinent positives and negatives documented in the history of present illness/assessment and plan.  Exam: Kim assistant Filed Vitals:   05/08/15 1417  BP: 134/84   General appearance:  Normal Abdomen soft nontender without masses guarding rebound Pelvic external BUS vagina with white discharge. Bimanual exam without masses or tenderness.  Colposcopy performed after acetic acid cleanse with no vaginal abnormality seen. Meticulous inspection of the cuff and folds showed no abnormalities. No biopsies taken.  Assessment/Plan:  49 y.o. G2P2 with:  1. ASCUS 2 negative high risk HPV. Colposcopy is negative. Plan follow up Pap smear this coming fall when she is due for her annual exam. 2. Urine odor.  Urinalysis is negative but wet prep is positive for bacterial vaginosis. Options for treatment reviewed and she elects for Cleocin vaginal cream nightly 7 days. Follow up if symptoms persist, worsen or recur.    Anastasio Auerbach MD, 2:34 PM 05/08/2015

## 2015-05-22 ENCOUNTER — Encounter: Payer: Self-pay | Admitting: Pulmonary Disease

## 2015-05-22 ENCOUNTER — Ambulatory Visit (INDEPENDENT_AMBULATORY_CARE_PROVIDER_SITE_OTHER): Payer: 59 | Admitting: Pulmonary Disease

## 2015-05-22 VITALS — BP 144/86 | HR 71 | Ht 68.0 in | Wt 228.6 lb

## 2015-05-22 DIAGNOSIS — G4733 Obstructive sleep apnea (adult) (pediatric): Secondary | ICD-10-CM | POA: Diagnosis not present

## 2015-05-22 NOTE — Patient Instructions (Signed)
Will arrange for home sleep study Will call to arrange for follow up after sleep study reviewed  

## 2015-05-22 NOTE — Progress Notes (Deleted)
   Subjective:    Patient ID: [name], female    DOB: February 21, 1966, 49 y.o.   MRN: [id number]  HPI    Review of Systems  Constitutional: Positive for unexpected weight change. Negative for fever.  HENT: Negative for congestion, dental problem, ear pain, nosebleeds, postnasal drip, rhinorrhea, sinus pressure, sneezing, sore throat and trouble swallowing.   Eyes: Negative for redness and itching.  Respiratory: Negative for cough, chest tightness, shortness of breath and wheezing.   Cardiovascular: Negative for palpitations and leg swelling.  Gastrointestinal: Negative for nausea and vomiting.       Acid Heartburn  Genitourinary: Negative for dysuria.  Musculoskeletal: Positive for joint swelling.  Skin: Negative for rash.  Neurological: Negative for headaches.  Hematological: Does not bruise/bleed easily.  Psychiatric/Behavioral: Positive for dysphoric mood. The patient is nervous/anxious.        Objective:   Physical Exam        Assessment & Plan:

## 2015-05-22 NOTE — Progress Notes (Signed)
Chief Complaint  Patient presents with  . SLEEP CONSULT    Referred by Dr Redmond Pulling.  Epworth Score: 3    History of Present Illness: [name] is a 49 y.o. female for evaluation of sleep problems.  She has noticed trouble falling asleep and staying asleep.  This has been an issue since she had an ankle injury.  After this she gained about 100 lbs.  She has tried [place], but this only helps if she uses sporadically.  She snores, and will stop breathing while asleep.  She will wake up hearing herself snore, and has trouble sleeping on her back.  She will wake up in the middle of dreams.  She goes to sleep at 11 pm.  She falls asleep after 20 to 30 minutes.  She wakes up 2 to 3 times to use the bathroom.  She gets out of bed at 530 am.  She feels tired in the morning.  She denies morning headache.  She does not use anything to help her fall sleep or stay awake.  She denies sleep walking, sleep talking, bruxism, or nightmares.  There is no history of restless legs.  She denies sleep hallucinations, sleep paralysis, or cataplexy.  The Epworth score is 3 out of 24.  Tests:   VALYNCIA WIENS  has a past medical history of Arthritis; Thyroid disease; Broken ankle; and ASCUS favor benign (09/2014, 03/2015).  ELLICIA ALIX  has past surgical history that includes Fracture surgery; Neuro stimulator implant; Abdominal hysterectomy (1993); and Tubal ligation (1997).  Prior to Admission medications   Medication Sig Start Date End Date Taking? Authorizing Provider  ALPRAZolam Duanne Moron) 0.5 MG tablet Take 0.5 mg by mouth daily as needed. For anxiety 05/11/14  Yes Historical Provider, MD  buPROPion (WELLBUTRIN XL) 300 MG 24 hr tablet Take 300 mg by mouth every morning. 06/05/14  Yes Historical Provider, MD  estradiol (VIVELLE-DOT) 0.05 MG/24HR patch Place 1 patch (0.05 mg total) onto the skin 2 (two) times a week. 12/15/14  Yes Anastasio Auerbach, MD  FLUoxetine (PROZAC) 40 MG capsule Take 40 mg by  mouth 2 (two) times daily.   Yes Historical Provider, MD  furosemide (LASIX) 40 MG tablet Take 40 mg by mouth daily. 03/20/15  Yes Historical Provider, MD  ibuprofen (ADVIL,MOTRIN) 600 MG tablet Take 1 tablet (600 mg total) by mouth every 6 (six) hours as needed. 07/27/14  Yes Carole Civil, MD  omeprazole (PRILOSEC) 20 MG capsule Take 20 mg by mouth daily.   Yes Historical Provider, MD  potassium chloride (MICRO-K) 10 MEQ CR capsule Take 10 mEq by mouth daily. 03/20/15  Yes Historical Provider, MD  QUEtiapine (SEROQUEL) 100 MG tablet Take 100 mg by mouth daily. 12/25/14  Yes Historical Provider, MD  traMADol (ULTRAM) 50 MG tablet Take 50 mg by mouth every 6 (six) hours as needed. pain 03/19/15  Yes Historical Provider, MD    No Known Allergies  Her family history includes Cancer in her father.  She  reports that she quit smoking about 3 years ago. Her smoking use included Cigarettes. She has a 40 pack-year smoking history. She does not have any smokeless tobacco history on file. She reports that she drinks about 3.0 oz of alcohol per week. She reports that she does not use illicit drugs.  Review of Systems  Constitutional: Positive for unexpected weight change. Negative for fever.  HENT: Negative for congestion, dental problem, ear pain, nosebleeds, postnasal drip, rhinorrhea, sinus pressure, sneezing,  sore throat and trouble swallowing.   Eyes: Negative for redness and itching.  Respiratory: Negative for cough, chest tightness, shortness of breath and wheezing.   Cardiovascular: Negative for palpitations and leg swelling.  Gastrointestinal: Negative for nausea and vomiting.       Acid Heartburn  Genitourinary: Negative for dysuria.  Musculoskeletal: Positive for joint swelling.  Skin: Negative for rash.  Neurological: Negative for headaches.  Hematological: Does not bruise/bleed easily.  Psychiatric/Behavioral: Positive for dysphoric mood. The patient is nervous/anxious.    Physical  Exam: BP 144/86 mmHg  Pulse 71  Ht 5\' 8"  (1.727 m)  Wt 228 lb 9.6 oz (103.692 kg)  BMI 34.77 kg/m2  SpO2 97%  General - No distress ENT - No sinus tenderness, no oral exudate, no LAN, no thyromegaly, TM clear, pupils equal/reactive Cardiac - s1s2 regular, no murmur, pulses symmetric Chest - No wheeze/rales/dullness, good air entry, normal respiratory excursion Back - No focal tenderness Abd - Soft, non-tender, no organomegaly, + bowel sounds Ext - No edema Neuro - Normal strength, cranial nerves intact Skin - No rashes Psych - Normal mood, and behavior  Discussion: She has history of insomnia.  However, she reports sleep disruption, apnea, snoring, and daytime sleepiness.  These started after ankle injury, and subsequent 100 lbs weight gain.  I am concerned she could have sleep apnea as a cause of her sleep difficulties.  We also discussed how sleep disruption can increase risks for accident, such as while driving.  Weight loss as a means of improving sleep apnea was also reviewed.  Additional treatment options discussed were CPAP therapy, oral appliance, and surgical intervention.   Assessment/plan:  Obstructive sleep apnea. Plan: - will arrange for home sleep study pending insurance approval  Obesity. Plan: - discussed importance of weight loss  Insomnia. Plan: - advised her to try taking seroquel at night - defer further assessment of this until after her sleep study is reviewed   Chesley Mires, M.D. Pager [phone]

## 2015-05-24 ENCOUNTER — Other Ambulatory Visit: Payer: Self-pay | Admitting: Pulmonary Disease

## 2015-05-24 DIAGNOSIS — G4733 Obstructive sleep apnea (adult) (pediatric): Secondary | ICD-10-CM

## 2015-05-30 ENCOUNTER — Other Ambulatory Visit (HOSPITAL_COMMUNITY): Payer: Self-pay | Admitting: Respiratory Therapy

## 2015-06-15 ENCOUNTER — Telehealth: Payer: Self-pay | Admitting: Pulmonary Disease

## 2015-06-15 NOTE — Telephone Encounter (Signed)
Spoke with Sharyn Lull at [facility] sleep lab, wanted to verify that pt was to have sleep study done at [facility].  I stated that she was scheduled for her sleep study at [facility] already.  Nothing further needed at this time.

## 2015-06-25 ENCOUNTER — Ambulatory Visit: Payer: 59 | Attending: Pulmonary Disease | Admitting: Sleep Medicine

## 2015-06-25 DIAGNOSIS — G4730 Sleep apnea, unspecified: Secondary | ICD-10-CM | POA: Diagnosis present

## 2015-06-25 DIAGNOSIS — G4733 Obstructive sleep apnea (adult) (pediatric): Secondary | ICD-10-CM

## 2015-07-03 ENCOUNTER — Telehealth: Payer: Self-pay | Admitting: Pulmonary Disease

## 2015-07-03 DIAGNOSIS — G4733 Obstructive sleep apnea (adult) (pediatric): Secondary | ICD-10-CM

## 2015-07-03 NOTE — Sleep Study (Signed)
Patient Name: [name], [place] Date: 06/25/2015 Gender: Female D.O.B: Apr 10, 1966 Age (years): 64 Referring Provider: Chesley Mires MD, ABSM Height (inches): 68 Interpreting Physician: Chesley Mires MD, ABSM Weight (lbs): 228 RPSGT: Rosebud Poles BMI: 35 MRN: [id number] Neck Size: 16.50  CLINICAL INFORMATION Sleep Study Type: NPSG  Indication for sleep study: OSA  Epworth Sleepiness Score: 5  MEDICATIONS Medications taken by the patient : AMBIEN, SEROQUEL  Medications administered by patient during sleep study : Sleep medicine administered - Ambien 5 mg at 09:46:08 PM  SLEEP STUDY TECHNIQUE As per the AASM Manual for the Scoring of Sleep and Associated Events v2.3 (April 2016) with a hypopnea requiring 4% desaturations.  The channels recorded and monitored were frontal, central and occipital EEG, electrooculogram (EOG), submentalis EMG (chin), nasal and oral airflow, thoracic and abdominal wall motion, anterior tibialis EMG, snore microphone, electrocardiogram, and pulse oximetry.  RESPIRATORY PARAMETERS There were a total of 8 respiratory disturbances out of which 0 were apneas ( 0 obstructive, 0 mixed, 0 central) and 8 hypopneas. The apnea/hypopnea index (AHI) was 1.9 events/hour. The central sleep apnea index was 0.0 events/hour. The REM AHI was 2.5 events/hour and NREM AHI was 1.8 events/hour. The supine AHI was 2.8 events/hour and the non supine AHI was 1.58 supine during 25.15% of sleep. Respiratory disturbances were associated with oxygen desaturation down to a nadir of 87.00% during sleep. The mean oxygen saturation during the study was 94.04%.  SLEEP ARCHITECTURE The study was initiated at 10:51:18 PM and terminated at 5:14:40 AM. The total recorded time was 383.4 minutes. EEG confirmed total sleep time was 253.1 minutes yielding a sleep efficiency of 66.0%. Sleep onset after lights out was 41.7 minutes with a REM latency of 274.5 minutes. The patient spent 10.86% of the  night in stage N1 sleep, 50.42% in stage N2 sleep, 19.55% in stage N3 and 19.16% in REM. Wake after sleep onset (WASO) was 88.5 minutes. The Arousal Index was 23.5/hour. Split Night criteria was not met.  LEG MOVEMENT DATA The total Periodic Limb Movements of Sleep (PLMS) were 9. The PLMS index was 2.13 .  CARDIAC DATA The 2 lead EKG demonstrated sinus rhythm. The mean heart rate was 84.29 beats per minute. Other EKG findings include: None.  IMPRESSIONS No significant obstructive sleep apnea occurred during this study (AHI = 1.9/hour). No significant central sleep apnea occurred during this study (CAI = 0.0). Mild oxygen desaturation was noted during this study (Min O2 = 87.00). The patient snored with Moderate snoring volume during the diagnostic portion of the study. No cardiac abnormalities were noted during this study. Clinically significant periodic limb movements did not occur during sleep.  DIAGNOSIS While this study shows several respiratory events, these were not frequent enough to qualify for diagnosis of sleep apnea.  RECOMMENDATIONS Avoid alcohol, sedatives and other CNS depressants that may worsen sleep apnea and disrupt normal sleep architecture. Sleep hygiene should be reviewed to assess factors that may improve sleep quality. Weight management and regular exercise should be initiated or continued. Return to Sleep Center for re-evaluation.  Chesley Mires, MD, [place] Board [employer] NPI: [id number] 07/03/2015, 1:17 PM

## 2015-07-03 NOTE — Telephone Encounter (Signed)
PSG 06/25/15 >> AHI 1.9, SpO2 low 87%.  Will have my nurse inform pt that sleep study did not show sleep apnea.  She will need ROV to discuss her sleep issues in more detail

## 2015-07-04 ENCOUNTER — Other Ambulatory Visit (HOSPITAL_COMMUNITY): Payer: Self-pay | Admitting: Respiratory Therapy

## 2015-07-04 NOTE — Telephone Encounter (Signed)
Pt scheduled for OV with TP 07/16/15 at 11am. Nothing further needed.

## 2015-07-13 ENCOUNTER — Other Ambulatory Visit (HOSPITAL_COMMUNITY): Payer: Self-pay | Admitting: Respiratory Therapy

## 2015-07-16 ENCOUNTER — Ambulatory Visit (INDEPENDENT_AMBULATORY_CARE_PROVIDER_SITE_OTHER): Payer: 59 | Admitting: Adult Health

## 2015-07-16 ENCOUNTER — Other Ambulatory Visit (HOSPITAL_COMMUNITY): Payer: Self-pay | Admitting: Respiratory Therapy

## 2015-07-16 ENCOUNTER — Encounter: Payer: Self-pay | Admitting: Adult Health

## 2015-07-16 VITALS — BP 128/70 | HR 63 | Temp 97.9°F | Ht 66.0 in | Wt 226.0 lb

## 2015-07-16 DIAGNOSIS — G479 Sleep disorder, unspecified: Secondary | ICD-10-CM

## 2015-07-16 NOTE — Patient Instructions (Signed)
May try snoring nasal strips.  Work on weight loss.  Healthy sleep regimen as we discussed.  follow up with Dr. Halford Chessman  As needed.

## 2015-07-16 NOTE — Progress Notes (Signed)
   Subjective:    Patient ID: [name], female    DOB: Mar 28, 1966, 49 y.o.   MRN: [id number]  HPI 49 yo female seen for sleep consult 05/22/15 for sleep problems.   07/16/2015 Follow up : Sleep study results  Pt returns for follow up to review recent sleep study .  She was seen last month for sleep consult due to trouble falling asleep and frequent awakenings. She had tried [place] in past with mixed results.  She underwent sleep study  On 06/25/15 that did not show sleep apnea. AHI was  1.9 , SaO2 87% no sign PLM  There was snoring . We discussed position changes and nasal strips to help with snoring.  She is on several meds for depression/anixety with prozac, wellbutrin and seroquel.  She denies sign caffeine . No decongestants .  Does watch tv at night in bedroom. We discussed healthy sleep regimen.    Review of Systems Constitutional:   No  weight loss, night sweats,  Fevers, chills, +fatigue, or  lassitude.  HEENT:   No headaches,  Difficulty swallowing,  Tooth/dental problems, or  Sore throat,                No sneezing, itching, ear ache, nasal congestion, post nasal drip,   CV:  No chest pain,  Orthopnea, PND, swelling in lower extremities, anasarca, dizziness, palpitations, syncope.   GI  No heartburn, indigestion, abdominal pain, nausea, vomiting, diarrhea, change in bowel habits, loss of appetite, bloody stools.   Resp: No shortness of breath with exertion or at rest.  No excess mucus, no productive cough,  No non-productive cough,  No coughing up of blood.  No change in color of mucus.  No wheezing.  No chest wall deformity  Skin: no rash or lesions.  GU: no dysuria, change in color of urine, no urgency or frequency.  No flank pain, no hematuria   MS:  No joint pain or swelling.  No decreased range of motion.  No back pain.  Psych:  No memory loss.         Objective:   Physical Exam  GEN: A/Ox3; pleasant , NAD, obese   HEENT:  [place]/AT,  EACs-clear, TMs-wnl,  NOSE-clear, THROAT-clear, no lesions, no postnasal drip or exudate noted.   NECK:  Supple w/ fair ROM; no JVD; normal carotid impulses w/o bruits; no thyromegaly or nodules palpated; no lymphadenopathy.  RESP  Clear  P & A; w/o, wheezes/ rales/ or rhonchi.no accessory muscle use, no dullness to percussion  CARD:  RRR, no m/r/g  , no peripheral edema, pulses intact, no cyanosis or clubbing.  GI:   Soft & nt; nml bowel sounds; no organomegaly or masses detected.  Musco: Warm bil, no deformities or joint swelling noted.   Neuro: alert, no focal deficits noted.    Skin: Warm, no lesions or rashes        Assessment & Plan:

## 2015-07-18 ENCOUNTER — Other Ambulatory Visit (HOSPITAL_COMMUNITY): Payer: Self-pay | Admitting: Respiratory Therapy

## 2015-07-18 DIAGNOSIS — G479 Sleep disorder, unspecified: Secondary | ICD-10-CM | POA: Insufficient documentation

## 2015-07-18 NOTE — Assessment & Plan Note (Signed)
Sleep disturbances with sleep initiation and maintenance  Pt education on healthy sleep regimen  PSG was neg for sleep apnea.  Has tried sleep meds in past with mixed results   Plan  May try snoring nasal strips.  Work on weight loss.  Healthy sleep regimen as we discussed.  follow up with Dr. Halford Chessman  As needed.

## 2015-07-20 ENCOUNTER — Other Ambulatory Visit (HOSPITAL_COMMUNITY): Payer: Self-pay | Admitting: Respiratory Therapy

## 2015-07-27 ENCOUNTER — Other Ambulatory Visit (HOSPITAL_COMMUNITY): Payer: Self-pay | Admitting: Respiratory Therapy

## 2015-08-03 ENCOUNTER — Other Ambulatory Visit (HOSPITAL_COMMUNITY): Payer: Self-pay | Admitting: Respiratory Therapy

## 2015-11-12 ENCOUNTER — Ambulatory Visit (INDEPENDENT_AMBULATORY_CARE_PROVIDER_SITE_OTHER): Payer: 59 | Admitting: Family Medicine

## 2015-11-12 ENCOUNTER — Ambulatory Visit (INDEPENDENT_AMBULATORY_CARE_PROVIDER_SITE_OTHER): Payer: 59

## 2015-11-12 VITALS — BP 116/82 | HR 82 | Temp 97.8°F | Resp 16 | Ht 66.5 in | Wt 224.8 lb

## 2015-11-12 DIAGNOSIS — M545 Low back pain, unspecified: Secondary | ICD-10-CM

## 2015-11-12 DIAGNOSIS — R0789 Other chest pain: Secondary | ICD-10-CM | POA: Diagnosis not present

## 2015-11-12 DIAGNOSIS — R112 Nausea with vomiting, unspecified: Secondary | ICD-10-CM | POA: Diagnosis not present

## 2015-11-12 DIAGNOSIS — R197 Diarrhea, unspecified: Secondary | ICD-10-CM | POA: Diagnosis not present

## 2015-11-12 DIAGNOSIS — R600 Localized edema: Secondary | ICD-10-CM

## 2015-11-12 LAB — CBC
HEMATOCRIT: 44.8 % (ref 36.0–46.0)
Hemoglobin: 15.2 g/dL — ABNORMAL HIGH (ref 12.0–15.0)
MCH: 31.7 pg (ref 26.0–34.0)
MCHC: 33.9 g/dL (ref 30.0–36.0)
MCV: 93.5 fL (ref 78.0–100.0)
MPV: 11 fL (ref 8.6–12.4)
Platelets: 257 10*3/uL (ref 150–400)
RBC: 4.79 MIL/uL (ref 3.87–5.11)
RDW: 12.9 % (ref 11.5–15.5)
WBC: 7.8 10*3/uL (ref 4.0–10.5)

## 2015-11-12 LAB — COMPREHENSIVE METABOLIC PANEL
ALT: 52 U/L — AB (ref 6–29)
AST: 38 U/L — ABNORMAL HIGH (ref 10–35)
Albumin: 4.3 g/dL (ref 3.6–5.1)
Alkaline Phosphatase: 113 U/L (ref 33–130)
BUN: 9 mg/dL (ref 7–25)
CALCIUM: 9.6 mg/dL (ref 8.6–10.4)
CHLORIDE: 101 mmol/L (ref 98–110)
CO2: 21 mmol/L (ref 20–31)
Creat: 0.68 mg/dL (ref 0.50–1.05)
GLUCOSE: 95 mg/dL (ref 65–99)
POTASSIUM: 5.1 mmol/L (ref 3.5–5.3)
Sodium: 134 mmol/L — ABNORMAL LOW (ref 135–146)
TOTAL PROTEIN: 7.5 g/dL (ref 6.1–8.1)
Total Bilirubin: 0.5 mg/dL (ref 0.2–1.2)

## 2015-11-12 LAB — TROPONIN I

## 2015-11-12 LAB — HEMOGLOBIN A1C
HEMOGLOBIN A1C: 5.8 % — AB (ref <5.7)
MEAN PLASMA GLUCOSE: 120 mg/dL — AB (ref <117)

## 2015-11-12 MED ORDER — ONDANSETRON HCL 8 MG PO TABS: 8.0000 mg | ORAL_TABLET | Freq: Three times a day (TID) | ORAL | Status: DC | PRN

## 2015-11-12 NOTE — Progress Notes (Addendum)
[facility] [address], [place] [address] [phone]  Date:  11/12/2015   Name:  [name]   DOB:  30-Dec-1965   MRN:  [id number]  PCP:  Woody Seller, MD    Chief Complaint: Back Pain and Emesis   History of Present Illness:  [name] is a 49 y.o. very pleasant female patient who presents with the following:  Here today as a new patient- she requests to see me for complaint of vomiting for the last 2 months. She will feel fine, but notes that after eating even just a few bites or drinking something she will often vomit.  She will vomit maybe 5-6 x a day over the last 2 months but has not yet sought any care for this. "I just thought it would go away." Never had this in the past.    She does not have any belly pain, but he back hurts and it seems to radiate to her sides at times  She is having liquid type stools- this has been present for about 2 weeks.  She is eating "when I can."  She has not noted a fever Never had a colonoscopy- she just turned 49 She has not seen any blood in her vomit or stools She has actually gained weight recently per her report  Wt Readings from Last 3 Encounters:  11/12/15 224 lb 12.8 oz (101.969 kg)  07/16/15 226 lb (102.513 kg)  06/25/15 228 lb (103.42 kg)   She has had some BLE edema recently and went on some lasix per her PCP.  The cause of her edema is not clear to her.  The lasix does help  About 2 years ago she had a neurostimulator placed in her back due to chronic leg pain resulting from a fracture.  This did resolve her leg pain.  She notes back pain now for about 1 month. Standing for more than a few minutes, bending are painful The pain is not there all the time.  She is most comfortable when she changes position frequently   The pain does not go down her legs.  No leg numbness or weakness. No injury that she can think of.    She also notes that both her arms have been "constantly numb" for 2  months- she thought this was from falling asleep in her arms at night.    No incontinence issues except she will leak urine when she laughs some of the time- this is not new  At the end of our interview she notes that "my chest is constantly heavy, it feels like someone put a battering ram in my chest and just held it there."  This has also been present for about 2 months, it is constant.  She has a heart murmur but no other cardiac history. In addition to this constant pressure she may have intermittent chest pain. This is non- exertional and occurs "mostly if I get riled up."   She last noticed the pain yesterday- no current CP  Her father just had an MI but he is in his 54s.  She is a former smoker- she quit about 5 years ago.   She has never had a stress test or other cardiac evaluation  Patient Active Problem List   Diagnosis Date Noted  . Sleep disturbance 07/18/2015  . Arthritis of ankle 09/07/2014  . Ankle fracture, right 07/29/2014    Past Medical History  Diagnosis Date  .  Arthritis   . Thyroid disease   . Broken ankle   . ASCUS favor benign 09/2014, 03/2015    negative high risk HPV 09/2014    Past Surgical History  Procedure Laterality Date  . Fracture surgery    . Neuro stimulator implant    . Abdominal hysterectomy  1993    endometriosis with pain  . Tubal ligation  1997    Social History  Substance Use Topics  . Smoking status: Former Smoker -- 1.00 packs/day for 40 years    Types: Cigarettes    Quit date: 06/24/2011  . Smokeless tobacco: None  . Alcohol Use: 3.0 oz/week    5 Standard drinks or equivalent per week     Comment: 2-3 beers a day    Family History  Problem Relation Age of Onset  . Cancer Father     Brain tumor    No Known Allergies  Medication list has been reviewed and updated.  Current Outpatient Prescriptions on File Prior to Visit  Medication Sig Dispense Refill  . ALPRAZolam (XANAX) 0.5 MG tablet Take 0.5 mg by mouth daily as  needed. For anxiety    . buPROPion (WELLBUTRIN XL) 300 MG 24 hr tablet Take 300 mg by mouth every morning.    Marland Kitchen FLUoxetine (PROZAC) 40 MG capsule Take 40 mg by mouth daily.     Marland Kitchen ibuprofen (ADVIL,MOTRIN) 600 MG tablet Take 1 tablet (600 mg total) by mouth every 6 (six) hours as needed. 60 tablet 5  . omeprazole (PRILOSEC) 20 MG capsule Take 20 mg by mouth daily.    . potassium chloride (MICRO-K) 10 MEQ CR capsule Take 10 mEq by mouth daily.    . traMADol (ULTRAM) 50 MG tablet Take 50 mg by mouth every 6 (six) hours as needed. pain    . estradiol (VIVELLE-DOT) 0.05 MG/24HR patch Place 1 patch (0.05 mg total) onto the skin 2 (two) times a week. (Patient not taking: Reported on 11/12/2015) 24 patch 3  . furosemide (LASIX) 40 MG tablet Take 40 mg by mouth daily. Reported on 11/12/2015    . QUEtiapine (SEROQUEL) 100 MG tablet Take 100 mg by mouth daily. Reported on 11/12/2015  3   No current facility-administered medications on file prior to visit.    Review of Systems:  As per HPI- otherwise negative.   Physical Examination: Filed Vitals:   11/12/15 1118  BP: 116/82  Pulse: 82  Temp: 97.8 F (36.6 C)  Resp: 16   Filed Vitals:   11/12/15 1118  Height: 5' 6.5" (1.689 m)  Weight: 224 lb 12.8 oz (101.969 kg)   Body mass index is 35.74 kg/(m^2). Ideal Body Weight: Weight in (lb) to have BMI = 25: 156.9  GEN: WDWN, NAD, Non-toxic, A & O x 3, obese, looks well HEENT: Atraumatic, Normocephalic. Neck supple. No masses, No LAD. Ears and Nose: No external deformity. CV: RRR, No M/G/R. No JVD. No thrill. No extra heart sounds. PULM: CTA B, no wheezes, crackles, rhonchi. No retractions. No resp. distress. No accessory muscle use. ABD: S, NT, ND, +BS. No rebound. No HSM.  Belly is benign EXTR: No c/c/.  No significant LE edema is appreicated today She has normal lumbar flexion and extension.  Normal strength of her BLE NEURO Normal gait.  PSYCH: Normally interactive. Conversant. Not  depressed or anxious appearing.  Calm demeanor.   [facility] reading (PRIMARY) by  Dr. Lorelei Pont. CXR: negative abd 2v:spinal stimulator in place, OW negative  Lumbar spine:  negative  CHEST 2 VIEW  COMPARISON: None.  FINDINGS: The heart size and mediastinal contours are within normal limits. There is no focal infiltrate, pulmonary edema, or pleural effusion. Spinal stimulator leads are identified. The visualized skeletal structures are unremarkable.  IMPRESSION: No active cardiopulmonary disease  ABDOMEN - 2 VIEW  COMPARISON: None.  FINDINGS: No free intraperitoneal air is identified. The bowel gas pattern is normal. Spinal stimulator is noted.  IMPRESSION: Negative exam.  LUMBAR SPINE - COMPLETE 4+ VIEW  COMPARISON: 11/12/2015  FINDINGS: There is no evidence of lumbar spine fracture. There is mild straightening of the lumbosacral spine lordosis. Intervertebral disc spaces are maintained. A nerve stimulator is seen terminating at the level of T10 vertebral body.  IMPRESSION: No evidence fracture subluxation, significant arthropathy.  Nurse stimulator ending at T10 vertebral body.  EKG:  SR, no ST changes  Results for orders placed or performed in visit on 11/12/15  Troponin I  Result Value Ref Range   Troponin I <0.01 <0.06 ng/mL     Assessment and Plan: Chest heaviness - Plan: EKG 12-Lead, DG Chest 2 View, Troponin I  Diarrhea, unspecified type - Plan: DG Abd 2 Views, CBC, Comprehensive metabolic panel, Hemoglobin A1c  Bilateral edema of lower extremity - Plan: Brain natriuretic peptide  Bilateral low back pain without sciatica - Plan: DG Lumbar Spine Complete  Non-intractable vomiting with nausea, unspecified vomiting type - Plan: ondansetron (ZOFRAN) 8 MG tablet  Here today with several potentially serious sx of about 2 months duration; however she continues to look well and her eval so far is benign which is reassuring  Her chest "heaviness" has  been present for 2 months. EKG is reassuring and troponin is negative.  She has complaint of lower extremity swelling so will check BNP Plan to refer to cardiology. Asked her to seek care if her sx change at all She also describes vomiting several times each day and having nothing but liquid stools for 1-2 months.  Check CMP, she has not lost significant weight Referral to GI Her back pain is not a major concern at this time.  Will follow-up if needed  Signed Lamar Blinks, MD  Received the rest of her labs and gave her a call 12/28- she is using the zofran and this does help her some.   Her GI appt is not until February- [place] [place].  Cardiology appt is in January. We will try and get her GI appt moved up Advised that she has pre-diabetes; she is aware of this  Results for orders placed or performed in visit on 11/12/15  CBC  Result Value Ref Range   WBC 7.8 4.0 - 10.5 K/uL   RBC 4.79 3.87 - 5.11 MIL/uL   Hemoglobin 15.2 (H) 12.0 - 15.0 g/dL   HCT 44.8 36.0 - 46.0 %   MCV 93.5 78.0 - 100.0 fL   MCH 31.7 26.0 - 34.0 pg   MCHC 33.9 30.0 - 36.0 g/dL   RDW 12.9 11.5 - 15.5 %   Platelets 257 150 - 400 K/uL   MPV 11.0 8.6 - 12.4 fL  Comprehensive metabolic panel  Result Value Ref Range   Sodium 134 (L) 135 - 146 mmol/L   Potassium 5.1 3.5 - 5.3 mmol/L   Chloride 101 98 - 110 mmol/L   CO2 21 20 - 31 mmol/L   Glucose, Bld 95 65 - 99 mg/dL   BUN 9 7 - 25 mg/dL   Creat 0.68 0.50 - 1.05 mg/dL  Total Bilirubin 0.5 0.2 - 1.2 mg/dL   Alkaline Phosphatase 113 33 - 130 U/L   AST 38 (H) 10 - 35 U/L   ALT 52 (H) 6 - 29 U/L   Total Protein 7.5 6.1 - 8.1 g/dL   Albumin 4.3 3.6 - 5.1 g/dL   Calcium 9.6 8.6 - 10.4 mg/dL  Troponin I  Result Value Ref Range   Troponin I <0.01 <0.06 ng/mL  Brain natriuretic peptide  Result Value Ref Range   Brain Natriuretic Peptide 8.3 0.0 - 100.0 pg/mL  Hemoglobin A1c  Result Value Ref Range   Hgb A1c MFr Bld 5.8 (H) <5.7 %   Mean Plasma Glucose 120 (H)  <117 mg/dL

## 2015-11-12 NOTE — Patient Instructions (Signed)
I will be in touch with your labs asap We will refer you to a gastroenterologist regarding your stomach and to a heart doctor regarding your chest symptoms Your back looks ok on x-ray- your other films look ok too Try the zofran as needed for the nausea and vomiting If anything is changing or getting worse please let me know!

## 2015-11-13 LAB — BRAIN NATRIURETIC PEPTIDE: Brain Natriuretic Peptide: 8.3 pg/mL (ref 0.0–100.0)

## 2015-11-15 ENCOUNTER — Encounter: Payer: Self-pay | Admitting: Gastroenterology

## 2015-11-15 ENCOUNTER — Encounter: Payer: Self-pay | Admitting: Family Medicine

## 2016-01-01 DIAGNOSIS — G629 Polyneuropathy, unspecified: Secondary | ICD-10-CM | POA: Insufficient documentation

## 2016-01-01 DIAGNOSIS — E669 Obesity, unspecified: Secondary | ICD-10-CM | POA: Insufficient documentation

## 2016-01-01 DIAGNOSIS — F32A Depression, unspecified: Secondary | ICD-10-CM | POA: Insufficient documentation

## 2016-01-01 DIAGNOSIS — R12 Heartburn: Secondary | ICD-10-CM | POA: Insufficient documentation

## 2016-01-01 DIAGNOSIS — R0683 Snoring: Secondary | ICD-10-CM | POA: Insufficient documentation

## 2016-01-01 DIAGNOSIS — M1990 Unspecified osteoarthritis, unspecified site: Secondary | ICD-10-CM | POA: Insufficient documentation

## 2016-01-01 DIAGNOSIS — L920 Granuloma annulare: Secondary | ICD-10-CM | POA: Insufficient documentation

## 2016-01-01 DIAGNOSIS — F411 Generalized anxiety disorder: Secondary | ICD-10-CM | POA: Insufficient documentation

## 2016-01-01 DIAGNOSIS — J342 Deviated nasal septum: Secondary | ICD-10-CM | POA: Insufficient documentation

## 2016-01-01 DIAGNOSIS — I10 Essential (primary) hypertension: Secondary | ICD-10-CM | POA: Insufficient documentation

## 2016-01-01 DIAGNOSIS — R609 Edema, unspecified: Secondary | ICD-10-CM | POA: Insufficient documentation

## 2016-01-01 DIAGNOSIS — F319 Bipolar disorder, unspecified: Secondary | ICD-10-CM | POA: Insufficient documentation

## 2016-01-01 DIAGNOSIS — R400 Somnolence: Secondary | ICD-10-CM | POA: Insufficient documentation

## 2016-01-08 ENCOUNTER — Ambulatory Visit (INDEPENDENT_AMBULATORY_CARE_PROVIDER_SITE_OTHER): Payer: 59 | Admitting: Gastroenterology

## 2016-01-08 ENCOUNTER — Encounter: Payer: Self-pay | Admitting: Gastroenterology

## 2016-01-08 ENCOUNTER — Other Ambulatory Visit: Payer: Self-pay

## 2016-01-08 VITALS — BP 116/72 | HR 60 | Ht 65.5 in | Wt 215.0 lb

## 2016-01-08 DIAGNOSIS — G43A0 Cyclical vomiting, not intractable: Secondary | ICD-10-CM

## 2016-01-08 DIAGNOSIS — Z1211 Encounter for screening for malignant neoplasm of colon: Secondary | ICD-10-CM | POA: Diagnosis not present

## 2016-01-08 DIAGNOSIS — R1013 Epigastric pain: Secondary | ICD-10-CM

## 2016-01-08 DIAGNOSIS — R1115 Cyclical vomiting syndrome unrelated to migraine: Secondary | ICD-10-CM

## 2016-01-08 MED ORDER — ONDANSETRON HCL 4 MG PO TABS: 4.0000 mg | ORAL_TABLET | Freq: Every day | ORAL | Status: AC | PRN

## 2016-01-08 NOTE — Patient Instructions (Signed)
You have been scheduled for a colonoscopy. Please follow written instructions given to you at your visit today.  Please pick up your prep supplies at the pharmacy within the next 1-3 days. If you use inhalers (even only as needed), please bring them with you on the day of your procedure. Your physician has requested that you go to www.startemmi.com and enter the access code given to you at your visit today. This web site gives a general overview about your procedure. However, you should still follow specific instructions given to you by our office regarding your preparation for the procedure.   We have sent the following medications to your pharmacy for you to pick up at your convenience: Zofran  Thank you for choosing [facility]  Dr Wilfrid Lund III

## 2016-01-08 NOTE — Progress Notes (Signed)
Patient ID: [name], female   DOB: 1966-10-29, 50 y.o.   MRN: [id number]  Gastroenterology and Hepatology Consult Note:  History: [name] 01/08/2016  Referring physician: Woody Seller, MD  Reason for consult/chief complaint: nausea and vomoting; Abdominal Cramping; and Diarrhea   Subjective HPI:  [name] was referred to me for episodic nausea and vomiting. For perhaps the last year she has episodes characterized by acute onset feeling of "warmth", nausea several episodes of nonbloody vomitus, and usually some lower abdominal cramps and a single episode of loose nonbloody stool. There are no definite triggers, but reports she has had significantly increased stress over the last year. There is no rash, wheezing or shortness of breath with these episodes. They last several hours to most of the day, and she feels somewhat fatigued for the next day or 2. It occurs perhaps every 4-6 weeks. In between, she feels well, is able to eat without trouble, and bowel habits are regular. She denies dysphagia or early satiety or weight loss.   ROS:  Review of Systems  Constitutional: Negative for appetite change and unexpected weight change.  HENT: Positive for nosebleeds. Negative for mouth sores and voice change.   Eyes: Negative for pain and redness.  Respiratory: Negative for cough and shortness of breath.   Cardiovascular: Negative for chest pain and palpitations.  Genitourinary: Negative for dysuria and hematuria.  Musculoskeletal: Positive for myalgias. Negative for arthralgias.  Skin: Negative for pallor and rash.  Neurological: Negative for weakness and headaches.  Hematological: Negative for adenopathy.  Psychiatric/Behavioral: Positive for dysphoric mood. The patient is nervous/anxious.    + sleep disturbance  Past Medical History: Past Medical History  Diagnosis Date  . Arthritis   . Enlarged thyroid   . Broken ankle   . ASCUS favor benign 09/2014, 03/2015   negative high risk HPV 09/2014  . Anxiety   . Depression   . HTN (hypertension)      Past Surgical History: Past Surgical History  Procedure Laterality Date  . Ankle fracture surgery Right   . Neuro stimulator implant    . Abdominal hysterectomy  1993    endometriosis with pain  . Tubal ligation  1997     Family History: Family History  Problem Relation Age of Onset  . Other Father     Brain tumor    Social History: Social History   Social History  . Marital Status: Married    Spouse Name: N/A  . Number of Children: 2  . Years of Education: N/A   Occupational History  . unemployed    Social History Main Topics  . Smoking status: Former Smoker -- 1.00 packs/day for 40 years    Types: Cigarettes    Quit date: 06/24/2011  . Smokeless tobacco: Never Used     Comment: uses e-sig 01/08/16  . Alcohol Use: 3.0 oz/week    5 Standard drinks or equivalent per week     Comment: 2-3 beers a day  . Drug Use: No  . Sexual Activity: Yes    Birth Control/ Protection: Surgical     Comment: HYST   Other Topics Concern  . None   Social History Narrative    Allergies: No Known Allergies  Outpatient Meds: Current Outpatient Prescriptions  Medication Sig Dispense Refill  . ALPRAZolam (XANAX) 0.5 MG tablet Take 0.5 mg by mouth daily as needed. For anxiety    . buPROPion (WELLBUTRIN XL) 300 MG 24 hr tablet Take 300 mg by mouth  every morning.    Marland Kitchen FLUoxetine (PROZAC) 40 MG capsule Take 40 mg by mouth daily.     . furosemide (LASIX) 40 MG tablet Take 40 mg by mouth daily. Reported on 11/12/2015    . ibuprofen (ADVIL,MOTRIN) 600 MG tablet Take 1 tablet (600 mg total) by mouth every 6 (six) hours as needed. 60 tablet 5  . metoprolol succinate (TOPROL-XL) 100 MG 24 hr tablet Take 1 tablet by mouth daily.     Marland Kitchen omeprazole (PRILOSEC) 20 MG capsule Take 20 mg by mouth daily.    . potassium chloride (MICRO-K) 10 MEQ CR capsule Take 10 mEq by mouth daily.    . traMADol (ULTRAM) 50  MG tablet Take 50 mg by mouth every 6 (six) hours as needed. pain    . ondansetron (ZOFRAN) 4 MG tablet Take 1 tablet (4 mg total) by mouth daily as needed for nausea or vomiting. 20 tablet 1   No current facility-administered medications for this visit.      ___________________________________________________________________ Objective  Exam: Husband present today BP 116/72 mmHg  Pulse 60  Ht 5' 5.5" (1.664 m)  Wt 97.523 kg (215 lb)  BMI 35.22 kg/m2  General: this is a obese middle-aged white female patient not acutely ill  Eyes: sclera anicteric, no redness  ENT: oral mucosa moist without lesions, no cervical or supraclavicular lymphadenopathy, good dentition  CV: RRR without murmur, S1/S2, no JVD,, no peripheral edema  Resp: clear to auscultation bilaterally, normal RR and effort noted  GI: soft, no tenderness, with active bowel sounds. No guarding or palpable organomegaly noted  Skin; warm and dry, no rash or jaundice noted  Neuro: awake, alert and oriented x 3. Normal gross motor function and fluent speech  Labs:  CBC    Component Value Date/Time   WBC 7.8 11/12/2015 1251   RBC 4.79 11/12/2015 1251   HGB 15.2* 11/12/2015 1251   HCT 44.8 11/12/2015 1251   PLT 257 11/12/2015 1251   MCV 93.5 11/12/2015 1251   MCH 31.7 11/12/2015 1251   MCHC 33.9 11/12/2015 1251   RDW 12.9 11/12/2015 1251    CMP     Component Value Date/Time   NA 134* 11/12/2015 1251   K 5.1 11/12/2015 1251   CL 101 11/12/2015 1251   CO2 21 11/12/2015 1251   GLUCOSE 95 11/12/2015 1251   BUN 9 11/12/2015 1251   CREATININE 0.68 11/12/2015 1251   CREATININE 0.86 03/21/2015 0717   CREATININE 0.84 03/21/2015 0717   CALCIUM 9.6 11/12/2015 1251   PROT 7.5 11/12/2015 1251   ALBUMIN 4.3 11/12/2015 1251   AST 38* 11/12/2015 1251   ALT 52* 11/12/2015 1251   ALKPHOS 113 11/12/2015 1251   BILITOT 0.5 11/12/2015 1251   GFRNONAA >60 03/21/2015 0717   GFRNONAA >60 03/21/2015 0717   GFRAA >60  03/21/2015 0717   GFRAA >60 03/21/2015 0717     Assessment: Encounter Diagnoses  Name Primary?  . Non-intractable cyclical vomiting with nausea Yes  . Colon cancer screening    It sounds most like cyclic vomiting syndrome, perhaps induced by acute on chronic psychologic stress. It seems less likely to be anything inflammatory neoplastic or or obstructive. She has had prior pelvic surgery, but I don't think this is intermittent SBO, because she has diarrhea with the episodes. Does not have the wheezing of carcinoid syndrome, no rash for mast cell activation syndrome.   Plan:  EGD to rule out upper digestive causes. Screening colonoscopy Zofran has worked well  in the past for these episodes, refill given  Thank you for the courtesy of this consult.  Please call me with any questions or concerns.  Nelida Meuse III

## 2016-02-01 ENCOUNTER — Encounter: Payer: 59 | Admitting: Gastroenterology

## 2016-02-11 ENCOUNTER — Other Ambulatory Visit: Payer: Self-pay | Admitting: Gastroenterology

## 2016-02-11 ENCOUNTER — Ambulatory Visit (AMBULATORY_SURGERY_CENTER): Payer: 59 | Admitting: Gastroenterology

## 2016-02-11 ENCOUNTER — Encounter: Payer: Self-pay | Admitting: Gastroenterology

## 2016-02-11 VITALS — BP 132/76 | HR 86 | Temp 98.4°F | Resp 13 | Ht 65.0 in | Wt 215.0 lb

## 2016-02-11 DIAGNOSIS — D125 Benign neoplasm of sigmoid colon: Secondary | ICD-10-CM

## 2016-02-11 DIAGNOSIS — R1115 Cyclical vomiting syndrome unrelated to migraine: Secondary | ICD-10-CM

## 2016-02-11 DIAGNOSIS — D128 Benign neoplasm of rectum: Secondary | ICD-10-CM

## 2016-02-11 DIAGNOSIS — Z1211 Encounter for screening for malignant neoplasm of colon: Secondary | ICD-10-CM | POA: Diagnosis not present

## 2016-02-11 DIAGNOSIS — D123 Benign neoplasm of transverse colon: Secondary | ICD-10-CM | POA: Diagnosis not present

## 2016-02-11 DIAGNOSIS — K210 Gastro-esophageal reflux disease with esophagitis: Secondary | ICD-10-CM

## 2016-02-11 DIAGNOSIS — D129 Benign neoplasm of anus and anal canal: Secondary | ICD-10-CM

## 2016-02-11 MED ORDER — SODIUM CHLORIDE 0.9 % IV SOLN: 500.0000 mL | INTRAVENOUS | Status: DC

## 2016-02-11 NOTE — Op Note (Signed)
[facility] Patient Name: [name] Procedure Date: 02/11/2016 2:07 PM MRN: [id number] Endoscopist: Rome. Loletha Carrow , MD Age: 50 Referring MD:  Date of Birth: [birth year] Gender: Female Procedure:                Colonoscopy Indications:              Screening for colorectal malignant neoplasm Medicines:                Monitored Anesthesia Care Procedure:                Pre-Anesthesia Assessment:                           - Prior to the procedure, a History and Physical                            was performed, and patient medications and                            allergies were reviewed. The patient's tolerance of                            previous anesthesia was also reviewed. The risks                            and benefits of the procedure and the sedation                            options and risks were discussed with the patient.                            All questions were answered, and informed consent                            was obtained. Prior Anticoagulants: The patient has                            taken no previous anticoagulant or antiplatelet                            agents. ASA Grade Assessment: II - A patient with                            mild systemic disease. After reviewing the risks                            and benefits, the patient was deemed in                            satisfactory condition to undergo the procedure.                           After obtaining informed consent, the colonoscope  was passed under direct vision. Throughout the                            procedure, the patient's blood pressure, pulse, and                            oxygen saturations were monitored continuously. The                            Model CF-HQ190L [id number]) scope was introduced                            through the anus and advanced to the the terminal                            ileum. The colonoscopy was performed  without                            difficulty. The patient tolerated the procedure                            well. The bowel preparation used was Miralax. The                            quality of the bowel preparation was good. The                            terminal ileum, ileocecal valve, appendiceal                            orifice, and rectum were photographed. Scope In: 2:35:10 PM Scope Out: 2:50:24 PM Scope Withdrawal Time: 0 hours 11 minutes 2 seconds  Total Procedure Duration: 0 hours 15 minutes 14 seconds  Findings:      The perianal and digital rectal examinations were normal.      Two sessile polyps were found in the distal sigmoid colon and proximal       transverse colon. The polyps were 2 to 4 mm in size. These polyps were       removed with a cold snare. Resection and retrieval were complete.      The exam was otherwise without abnormality on direct and retroflexion       views. Complications:            No immediate complications. Estimated Blood Loss:     Estimated blood loss: none. Impression:               - Two 2 to 4 mm polyps in the distal sigmoid colon                            and in the proximal transverse colon, removed with                            a cold snare. Resected and retrieved.                           -  The examination was otherwise normal on direct                            and retroflexion views. Recommendation:           - Patient has a contact number available for                            emergencies. The signs and symptoms of potential                            delayed complications were discussed with the                            patient. Return to normal activities tomorrow.                            Written discharge instructions were provided to the                            patient.                           - Resume previous diet.                           - Continue present medications.                           - Await  pathology results.                           - Repeat colonoscopy is recommended for                            surveillance. The colonoscopy date will be                            determined after pathology results from today's                            exam become available for review. Procedure Code(s):        --- Professional ---                           [phone], Colonoscopy, flexible; with removal of                            tumor(s), polyp(s), or other lesion(s) by snare                            technique CPT copyright 2016 [employer]. All rights reserved. Henry L. Loletha Carrow, MD 02/11/2016 3:00:22 PM This report has been signed electronically. Number of Addenda: 0 Referring MD:      Jama Flavors. Redmond Pulling

## 2016-02-11 NOTE — Progress Notes (Signed)
Report given to RN, vss

## 2016-02-11 NOTE — Patient Instructions (Addendum)

## 2016-02-11 NOTE — Op Note (Signed)
[facility] Patient Name: [name] Procedure Date: 02/11/2016 2:09 PM MRN: [id number] Endoscopist: Mallie Mussel L. Loletha Carrow , MD Age: 50 Referring MD:  Date of Birth: 1966/01/29 Gender: Female Procedure:                Upper GI endoscopy Indications:              Vomiting Medicines:                Monitored Anesthesia Care Procedure:                Pre-Anesthesia Assessment:                           - Prior to the procedure, a History and Physical                            was performed, and patient medications and                            allergies were reviewed. The patient's tolerance of                            previous anesthesia was also reviewed. The risks                            and benefits of the procedure and the sedation                            options and risks were discussed with the patient.                            All questions were answered, and informed consent                            was obtained. Prior Anticoagulants: The patient has                            taken no previous anticoagulant or antiplatelet                            agents. ASA Grade Assessment: II - A patient with                            mild systemic disease. After reviewing the risks                            and benefits, the patient was deemed in                            satisfactory condition to undergo the procedure.                           After obtaining informed consent, the endoscope was  passed under direct vision. Throughout the                            procedure, the patient's blood pressure, pulse, and                            oxygen saturations were monitored continuously. The                            Model [id number]-[id number]) scope was introduced                            through the mouth, and advanced to the second part                            of duodenum. The upper GI endoscopy was   accomplished without difficulty. The patient                            tolerated the procedure well. Scope In: Scope Out: Findings:      The stomach was normal.      The examined duodenum was normal.      LA Grade A (one or more mucosal breaks less than 5 mm, not extending       between tops of 2 mucosal folds) esophagitis with no bleeding was found. Complications:            No immediate complications. Estimated Blood Loss:     Estimated blood loss: none. Impression:               - Normal stomach.                           - Normal examined duodenum.                           - LA Grade A reflux esophagitis.                           - No specimens collected.                           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 Recommendation:           - Patient has a contact number available for                            emergencies. The signs and symptoms of potential                            delayed complications were discussed with the                            patient. Return to normal activities tomorrow.  Written discharge instructions were provided to the                            patient.                           - Resume previous diet.                           - Continue present medications.                           - No repeat upper endoscopy.                           - Follow an antireflux regimen. Procedure Code(s):        --- Professional ---                           [id number], Esophagogastroduodenoscopy, flexible,                            transoral; diagnostic, including collection of                            specimen(s) by brushing or washing, when performed                            (separate procedure) CPT copyright 2016 [employer]. All rights reserved. Marycarmen Hagey L. Loletha Carrow, MD 02/11/2016 2:56:22 PM This report has been signed electronically. Number of Addenda: 0 Referring  MD:      Jama Flavors. Redmond Pulling

## 2016-02-11 NOTE — Progress Notes (Signed)
Called to room to assist during endoscopic procedure.  Patient ID and intended procedure confirmed with present staff. Received instructions for my participation in the procedure from the performing physician.  

## 2016-02-12 ENCOUNTER — Telehealth: Payer: Self-pay

## 2016-02-12 NOTE — Telephone Encounter (Signed)
  Follow up Call-  Call back number 02/11/2016  Post procedure Call Back phone  # [phone]  Permission to leave phone message Yes    Patient reports that she experienced some soreness under her jaw after her procedure on 02/11/2016. Patient reports that it is a little better this morning.   Patient questions:  Do you have a fever, pain , or abdominal swelling? No. Pain Score  0 *  Have you tolerated food without any problems? Yes.    Have you been able to return to your normal activities? Yes.    Do you have any questions about your discharge instructions: Diet   No. Medications  No. Follow up visit  No.  Do you have questions or concerns about your Care? No.  Actions: * If pain score is 4 or above: No action needed, pain <4.

## 2016-02-15 ENCOUNTER — Encounter: Payer: Self-pay | Admitting: Gastroenterology

## 2017-04-08 ENCOUNTER — Encounter: Payer: Self-pay | Admitting: Orthopaedic Surgery

## 2017-04-08 ENCOUNTER — Ambulatory Visit (INDEPENDENT_AMBULATORY_CARE_PROVIDER_SITE_OTHER): Payer: Managed Care, Other (non HMO)

## 2017-04-08 ENCOUNTER — Ambulatory Visit (INDEPENDENT_AMBULATORY_CARE_PROVIDER_SITE_OTHER): Payer: Managed Care, Other (non HMO) | Admitting: Orthopaedic Surgery

## 2017-04-08 VITALS — BP 83/59 | HR 53 | Temp 97.7°F | Ht 66.0 in | Wt 224.0 lb

## 2017-04-08 DIAGNOSIS — M25511 Pain in right shoulder: Secondary | ICD-10-CM

## 2017-04-08 DIAGNOSIS — F17290 Nicotine dependence, other tobacco product, uncomplicated: Secondary | ICD-10-CM | POA: Diagnosis not present

## 2017-04-08 NOTE — Progress Notes (Signed)
Subjective:    Patient ID: [name], female    DOB: [birth year], 51 y.o.   MRN: [id number]  HPI She has had pain in the right shoulder for several months.  She has seen Dr. Lovena Le, chiropractor for this during this time period.  She has no trauma, no redness, no swelling of the right shoulder.  She has no paresthesias. She has not improved. She does not like to take medicine and has tried Tylenol with no help.  She has used heat and ice.  She is not improving and was referred here.  She has no neck pain, no left shoulder pain or other joint pains.  She smokes vapor cigarettes.  She is trying to cut back.  Review of Systems  HENT: Negative for congestion.   Respiratory: Negative for cough and shortness of breath.   Cardiovascular: Negative for chest pain and leg swelling.  Endocrine: Negative for cold intolerance.  Musculoskeletal: Positive for arthralgias.  Allergic/Immunologic: Negative for environmental allergies.   Past Medical History:  Diagnosis Date  . Anxiety   . Arthritis   . ASCUS favor benign 09/2014, 03/2015   negative high risk HPV 09/2014  . Broken ankle   . Depression   . Enlarged thyroid   . GERD (gastroesophageal reflux disease)   . Heart murmur   . HTN (hypertension)     Past Surgical History:  Procedure Laterality Date  . ABDOMINAL HYSTERECTOMY  1993   endometriosis with pain  . ANKLE FRACTURE SURGERY Right   . Neuro stimulator implant    . orif lt wrist    . TUBAL LIGATION  1997    Current Outpatient Prescriptions on File Prior to Visit  Medication Sig Dispense Refill  . ALPRAZolam (XANAX) 0.5 MG tablet Take 0.5 mg by mouth daily as needed. For anxiety    . buPROPion (WELLBUTRIN XL) 300 MG 24 hr tablet Take 300 mg by mouth every morning.    . furosemide (LASIX) 40 MG tablet Take 40 mg by mouth daily. Reported on 11/12/2015    . omeprazole (PRILOSEC) 20 MG capsule Take 20 mg by mouth daily.    . traMADol (ULTRAM) 50 MG tablet Take 50 mg by mouth  every 6 (six) hours as needed. pain    . FLUoxetine (PROZAC) 40 MG capsule Take 40 mg by mouth daily.     Marland Kitchen ibuprofen (ADVIL,MOTRIN) 600 MG tablet Take 1 tablet (600 mg total) by mouth every 6 (six) hours as needed. 60 tablet 5  . metoprolol succinate (TOPROL-XL) 100 MG 24 hr tablet Take 1 tablet by mouth daily.     . potassium chloride (MICRO-K) 10 MEQ CR capsule Take 10 mEq by mouth daily.    . Turmeric 450 MG CAPS Take by mouth.    . zolpidem (AMBIEN) 5 MG tablet      No current facility-administered medications on file prior to visit.     Social History   Social History  . Marital status: Married    Spouse name: N/A  . Number of children: 2  . Years of education: N/A   Occupational History  . unemployed    Social History Main Topics  . Smoking status: Former Smoker    Packs/day: 1.00    Years: 40.00    Types: Cigarettes    Quit date: 06/24/2011  . Smokeless tobacco: Never Used     Comment: uses e-sig 01/08/16  . Alcohol use 3.0 oz/week    5 Standard drinks or equivalent  per week     Comment: 2-3 beers a day  . Drug use: No  . Sexual activity: Yes    Birth control/ protection: Surgical     Comment: HYST   Other Topics Concern  . Not on file   Social History Narrative  . No narrative on file    Family History  Problem Relation Age of Onset  . Other Father        Brain tumor  . Heart disease Father     BP (!) 83/59   Pulse (!) 53   Temp 97.7 F (36.5 C)   Ht 5\' 6"  (1.676 m)   Wt 224 lb (101.6 kg)   BMI 36.15 kg/m      Objective:   Physical Exam  Constitutional: She is oriented to person, place, and time. She appears well-developed and well-nourished.  HENT:  Head: Normocephalic and atraumatic.  Eyes: Conjunctivae and EOM are normal. Pupils are equal, round, and reactive to light.  Neck: Normal range of motion. Neck supple.  Cardiovascular: Normal rate, regular rhythm and intact distal pulses.   Pulmonary/Chest: Effort normal.  Abdominal: Soft.   Musculoskeletal: She exhibits tenderness (Pain right shoulder, ROM forward 160, abduction 145, internal 35, external 35, adduction full, extension 15, no redness, no swelling, NV intact.  Neck negative.  Left shoulder negative.).  Neurological: She is alert and oriented to person, place, and time. She displays normal reflexes. No cranial nerve deficit. She exhibits normal muscle tone. Coordination normal.  Skin: Skin is warm and dry.  Psychiatric: She has a normal mood and affect. Her behavior is normal. Judgment and thought content normal.  Vitals reviewed.   X-rays were done of the right shoulder, reported separately.      Assessment & Plan:   Encounter Diagnoses  Name Primary?  . Pain in joint of right shoulder Yes  . Other tobacco product nicotine dependence, uncomplicated    PROCEDURE NOTE:  The patient request injection, verbal consent was obtained.  The right shoulder was prepped appropriately after time out was performed.   Sterile technique was observed and injection of 1 cc of Depo-Medrol 40 mg with several cc's of plain xylocaine. Anesthesia was provided by ethyl chloride and a 20-gauge needle was used to inject the shoulder area. A posterior approach was used.  The injection was tolerated well.  A band aid dressing was applied.  The patient was advised to apply ice later today and tomorrow to the injection sight as needed.  I will set up MRI of the right shoulder.  She has not improved over several months.    Return after MRI.  Call if any problem.  Precautions discussed.  Electronically Signed Sanjuana Kava, MD 5/23/201810:08 AM

## 2017-04-08 NOTE — Addendum Note (Signed)
Addended by: Tressa Busman L on: 04/08/2017 10:16 AM   Modules accepted: Orders

## 2017-04-20 ENCOUNTER — Telehealth: Payer: Self-pay | Admitting: Orthopaedic Surgery

## 2017-04-20 NOTE — Telephone Encounter (Signed)
Patient called, realized has neuro-stimulator in her back; therefore, said she believes she is not to have an MRI.  States she did not relay this information at time of MRI scheduling, for 04/22/17. Please call patient to advise. [place] [phone]

## 2017-04-21 ENCOUNTER — Other Ambulatory Visit: Payer: Self-pay | Admitting: Radiology

## 2017-04-21 DIAGNOSIS — M25511 Pain in right shoulder: Secondary | ICD-10-CM

## 2017-04-21 NOTE — Telephone Encounter (Signed)
Changed to CT arthogram.  Patient notified.

## 2017-04-22 ENCOUNTER — Ambulatory Visit (HOSPITAL_COMMUNITY): Payer: Managed Care, Other (non HMO)

## 2017-04-28 ENCOUNTER — Ambulatory Visit: Payer: 59 | Admitting: Orthopaedic Surgery

## 2017-04-28 ENCOUNTER — Ambulatory Visit: Payer: Medicare Other | Admitting: Orthopaedic Surgery

## 2017-05-08 ENCOUNTER — Ambulatory Visit (HOSPITAL_COMMUNITY): Payer: Managed Care, Other (non HMO)

## 2017-05-08 ENCOUNTER — Other Ambulatory Visit (HOSPITAL_COMMUNITY): Payer: Managed Care, Other (non HMO)

## 2017-05-13 ENCOUNTER — Ambulatory Visit: Payer: 59 | Admitting: Orthopaedic Surgery

## 2017-05-25 ENCOUNTER — Ambulatory Visit (HOSPITAL_COMMUNITY)
Admission: RE | Admit: 2017-05-25 | Discharge: 2017-05-25 | Disposition: A | Discharge status: Home / Self Care | Payer: 59 | Source: Ambulatory Visit | Attending: Orthopaedic Surgery | Admitting: Orthopaedic Surgery

## 2017-05-25 ENCOUNTER — Encounter (HOSPITAL_COMMUNITY): Payer: Self-pay

## 2017-05-25 DIAGNOSIS — M25511 Pain in right shoulder: Secondary | ICD-10-CM

## 2017-05-25 DIAGNOSIS — S46811A Strain of other muscles, fascia and tendons at shoulder and upper arm level, right arm, initial encounter: Secondary | ICD-10-CM | POA: Diagnosis not present

## 2017-05-25 DIAGNOSIS — X58XXXA Exposure to other specified factors, initial encounter: Secondary | ICD-10-CM | POA: Diagnosis not present

## 2017-05-25 MED ORDER — LIDOCAINE HCL (PF) 1 % IJ SOLN
INTRAMUSCULAR | Status: AC
  Administered 2017-05-25: 1 mL
  Filled 2017-05-25: qty 5

## 2017-05-25 MED ORDER — SODIUM CHLORIDE 0.9 % IJ SOLN
INTRAMUSCULAR | Status: AC
Start: 2017-05-25 — End: 2017-05-25
  Administered 2017-05-25: 10 mL
  Filled 2017-05-25: qty 10

## 2017-05-25 MED ORDER — POVIDONE-IODINE 10 % EX SOLN
CUTANEOUS | Status: AC
  Filled 2017-05-25: qty 15

## 2017-05-25 MED ORDER — IOPAMIDOL (ISOVUE-300) INJECTION 61%
INTRAVENOUS | Status: AC
  Administered 2017-05-25: 5 mL
  Filled 2017-05-25: qty 30

## 2017-05-27 ENCOUNTER — Encounter: Payer: Self-pay | Admitting: Orthopaedic Surgery

## 2017-05-27 ENCOUNTER — Ambulatory Visit (INDEPENDENT_AMBULATORY_CARE_PROVIDER_SITE_OTHER): Payer: Managed Care, Other (non HMO) | Admitting: Orthopaedic Surgery

## 2017-05-27 VITALS — BP 135/71 | HR 81 | Temp 97.5°F | Ht 66.0 in | Wt 230.0 lb

## 2017-05-27 DIAGNOSIS — F17290 Nicotine dependence, other tobacco product, uncomplicated: Secondary | ICD-10-CM

## 2017-05-27 DIAGNOSIS — M25511 Pain in right shoulder: Secondary | ICD-10-CM

## 2017-05-27 NOTE — Progress Notes (Signed)
Patient FX:[id number] [name], female DOB:06-03-1966, 51 y.o. ZHG:[id number]  Chief Complaint  Patient presents with  . Follow-up    Rt shoulder CT review    HPI  [name] is a 51 y.o. female who has right shoulder pain. She had CT arthrogram of the shoulder.  It showed: IMPRESSION: 1. Subtle partial-thickness rim rent tear of the anterior aspect of the distal supraspinous tendon. 2. Small posterosuperior labral tear.  I have explained the findings to her.  She states it is hurting her more and more.  I will have [name] see her and give his opinion.  HPI  Body mass index is 37.12 kg/m.  ROS  Review of Systems  HENT: Negative for congestion.   Respiratory: Negative for cough and shortness of breath.   Cardiovascular: Negative for chest pain and leg swelling.  Endocrine: Negative for cold intolerance.  Musculoskeletal: Positive for arthralgias.  Allergic/Immunologic: Negative for environmental allergies.    Past Medical History:  Diagnosis Date  . Anxiety   . Arthritis   . ASCUS favor benign 09/2014, 03/2015   negative high risk HPV 09/2014  . Broken ankle   . Depression   . Enlarged thyroid   . GERD (gastroesophageal reflux disease)   . Heart murmur   . HTN (hypertension)     Past Surgical History:  Procedure Laterality Date  . ABDOMINAL HYSTERECTOMY  1993   endometriosis with pain  . ANKLE FRACTURE SURGERY Right   . Neuro stimulator implant    . orif lt wrist    . TUBAL LIGATION  1997    Family History  Problem Relation Age of Onset  . Other Father        Brain tumor  . Heart disease Father     Social History Social History  Substance Use Topics  . Smoking status: Former Smoker    Packs/day: 1.00    Years: 40.00    Types: Cigarettes    Quit date: 06/24/2011  . Smokeless tobacco: Never Used     Comment: uses e-sig 01/08/16  . Alcohol use 3.0 oz/week    5 Standard drinks or equivalent per week     Comment: 2-3 beers a day    No  Known Allergies  Current Outpatient Prescriptions  Medication Sig Dispense Refill  . ALPRAZolam (XANAX) 0.5 MG tablet Take 0.5 mg by mouth daily as needed. For anxiety    . buPROPion (WELLBUTRIN XL) 300 MG 24 hr tablet Take 300 mg by mouth every morning.    Marland Kitchen FLUoxetine (PROZAC) 40 MG capsule Take 40 mg by mouth daily.     . furosemide (LASIX) 40 MG tablet Take 40 mg by mouth daily. Reported on 11/12/2015    . ibuprofen (ADVIL,MOTRIN) 600 MG tablet Take 1 tablet (600 mg total) by mouth every 6 (six) hours as needed. 60 tablet 5  . metoprolol succinate (TOPROL-XL) 100 MG 24 hr tablet Take 1 tablet by mouth daily.     Marland Kitchen omeprazole (PRILOSEC) 20 MG capsule Take 20 mg by mouth daily.    . potassium chloride (MICRO-K) 10 MEQ CR capsule Take 10 mEq by mouth daily.    . traMADol (ULTRAM) 50 MG tablet Take 50 mg by mouth every 6 (six) hours as needed. pain    . Turmeric 450 MG CAPS Take by mouth.    . zolpidem (AMBIEN) 5 MG tablet      No current facility-administered medications for this visit.      Physical Exam  Blood pressure 135/71, pulse 81, temperature (!) 97.5 F (36.4 C), height 5\' 6"  (1.676 m), weight 230 lb (104.3 kg).  Constitutional: overall normal hygiene, normal nutrition, well developed, normal grooming, normal body habitus. Assistive device:none  Musculoskeletal: gait and station Limp none, muscle tone and strength are normal, no tremors or atrophy is present.  .  Neurological: coordination overall normal.  Deep tendon reflex/nerve stretch intact.  Sensation normal.  Cranial nerves II-XII intact.   Skin:   Normal overall no scars, lesions, ulcers or rashes. No psoriasis.  Psychiatric: Alert and oriented x 3.  Recent memory intact, remote memory unclear.  Normal mood and affect. Well groomed.  Good eye contact.  Cardiovascular: overall no swelling, no varicosities, no edema bilaterally, normal temperatures of the legs and arms, no clubbing, cyanosis and good capillary  refill.  Lymphatic: palpation is normal.  Right shoulder is painful but has near full ROM.  More pain in the extremes.  NV intact.  The patient has been educated about the nature of the problem(s) and counseled on treatment options.  The patient appeared to understand what I have discussed and is in agreement with it.  Encounter Diagnoses  Name Primary?  . Pain in joint of right shoulder Yes  . Other tobacco product nicotine dependence, uncomplicated     PLAN Call if any problems.  Precautions discussed.  Continue current medications.   Return to clinic see [name]   Electronically Signed Vanessa Kava, MD 7/11/201810:58 AM

## 2017-06-10 ENCOUNTER — Ambulatory Visit (INDEPENDENT_AMBULATORY_CARE_PROVIDER_SITE_OTHER): Payer: 59 | Admitting: Orthopedic Surgery

## 2017-06-10 ENCOUNTER — Encounter: Payer: Self-pay | Admitting: Orthopedic Surgery

## 2017-06-10 VITALS — BP 131/81 | HR 80 | Temp 98.0°F | Ht 67.0 in | Wt 226.0 lb

## 2017-06-10 DIAGNOSIS — M75101 Unspecified rotator cuff tear or rupture of right shoulder, not specified as traumatic: Secondary | ICD-10-CM

## 2017-06-10 MED ORDER — HYDROCODONE-ACETAMINOPHEN 5-325 MG PO TABS: 1.0000 | ORAL_TABLET | Freq: Three times a day (TID) | ORAL | 0 refills | Status: DC | PRN

## 2017-06-10 NOTE — Progress Notes (Signed)
Preop consultation   Chief Complaint  Patient presents with  . New Patient (Initial Visit)    Right shoulder    51 year old female presents for possible surgery in her right shoulder. She complains of moderate amount of right shoulder pain which is dull aching throbbing worse with reaching behind her in sleeping at night and with forward elevation and abduction of the right shoulder. Her preop treatment has included ibuprofen visits to the chiropractor where they did electrical stimulation and ultrasound treatment she had an injection she use Biofreeze she used ice  However, she could not have an MRI she had a CT arthrogram which showed a subtle partial-thickness rim rent tear of the anterior aspect of the distal supraspinatus tendon with a small posterior superior labral tear plain films look normal and I confirm that by visualizing the films    Review of Systems  Constitutional: Negative for chills and fever.  Musculoskeletal: Positive for back pain and joint pain.  Neurological: Negative for tingling and sensory change.     Past Medical History:  Diagnosis Date  . Anxiety   . Arthritis   . ASCUS favor benign 09/2014, 03/2015   negative high risk HPV 09/2014  . Broken ankle   . Depression   . Enlarged thyroid   . GERD (gastroesophageal reflux disease)   . Heart murmur   . HTN (hypertension)     Past Surgical History:  Procedure Laterality Date  . ABDOMINAL HYSTERECTOMY  1993   endometriosis with pain  . ANKLE FRACTURE SURGERY Right   . Neuro stimulator implant    . orif lt wrist    . TUBAL LIGATION  1997    Family History  Problem Relation Age of Onset  . Other Father        Brain tumor  . Heart disease Father    Social History  Substance Use Topics  . Smoking status: Former Smoker    Packs/day: 1.00    Years: 40.00    Types: Cigarettes    Quit date: 06/24/2011  . Smokeless tobacco: Never Used     Comment: uses e-sig 01/08/16  . Alcohol use 3.0 oz/week   5 Standard drinks or equivalent per week     Comment: 2-3 beers a day    BP 131/81   Pulse 80   Temp 98 F (36.7 C)   Ht 5\' 7"  (1.702 m)   Wt 226 lb (102.5 kg)   BMI 35.40 kg/m   Physical Exam  Constitutional: She is oriented to person, place, and time. She appears well-developed and well-nourished.  Neurological: She is alert and oriented to person, place, and time.  Psychiatric: She has a normal mood and affect.  Vitals reviewed.   Left Shoulder Exam   Range of Motion  Normal left shoulder ROM  Muscle Strength  Normal left shoulder strength  Tests  Impingement:   Negative Apprehension: Negative Sulcus:            Negative  Comments:  Neurovascular exam normal  Right Shoulder Exam   Tenderness  The patient is experiencing tenderness in the Rotator interval.  Range of Motion  Active Abduction:                       120 Passive Abduction:                    120 Forward Flexion:  90 External Rotation:                      50  Muscle Strength  Abduction:            5/5 Internal Rotation:  5/5 Supraspinatus:     5/5 Subscapularis:     5/5 Biceps:                 5/5  Tests  Impingement:   Negative Drop Arm:        Negative Apprehension: Negative Sulcus:            Negative    Cervical spine full range of motion nontender   Meds ordered this encounter  Medications  . HYDROcodone-acetaminophen (NORCO) 5-325 MG tablet    Sig: Take 1 tablet by mouth every 8 (eight) hours as needed for moderate pain.    Dispense:  21 tablet    Refill:  0    Encounter Diagnosis  Name Primary?  . Tear of right rotator cuff, unspecified tear extent Yes    CT arthrogram read as IMPRESSION: 1. Subtle partial-thickness rim rent tear of the anterior aspect of the distal supraspinous tendon. 2. Small posterosuperior labral tear.     Electronically Signed   By: Lorriane Shire M.D.   On: 05/25/2017 11:40   I reviewed these findings with the  patient as well as her clinical exam  She does not seem like she's had a formal physical therapy course so we have both agreed that she will try that first since this CT arthrogram is a suboptimal study but was necessary based on the stimulator that she has in her back  She will see me in 4 weeks if she's not better then we'll proceed with a diagnostic arthroscopy with possibility of rotator cuff repair  She was given medication to take as needed for pain.  We discussed opiate use and abuse  PLAN:

## 2017-07-13 ENCOUNTER — Ambulatory Visit (INDEPENDENT_AMBULATORY_CARE_PROVIDER_SITE_OTHER): Payer: 59 | Admitting: Orthopedic Surgery

## 2017-07-13 DIAGNOSIS — M75101 Unspecified rotator cuff tear or rupture of right shoulder, not specified as traumatic: Secondary | ICD-10-CM | POA: Diagnosis not present

## 2017-07-13 MED ORDER — HYDROCODONE-ACETAMINOPHEN 5-325 MG PO TABS: 1.0000 | ORAL_TABLET | Freq: Three times a day (TID) | ORAL | 0 refills | Status: DC | PRN

## 2017-07-13 NOTE — Patient Instructions (Addendum)
Continue therapy

## 2017-07-13 NOTE — Progress Notes (Signed)
Patient ID: [name], female   DOB: August 04, 1966, 51 y.o.   MRN: [id number]   FOLLOW UP VISIT    Chief Complaint  Patient presents with  . Shoulder Pain    Rotator cuff tear right shoulder    51 year old female with a rotator cuff tear diagnosed with a CT scan went for therapy did not get a lot better but thinks it's helping  Currently on ibuprofen and hydrocodone    Review of Systems  Neurological: Positive for tingling.       One episode of tingling in therapy with doing resisted external rotation exercises right shoulder      Ortho Exam no major change in clinical exam She is oriented 3 mood and affect are pleasant Gen. appearance is normal she has normal external/internal rotation slight decrease in flexion shoulder is stable motor exam shows minimal weakness skin normal right shoulder   A/P  Medical decision-making  Encounter Diagnosis  Name Primary?  . Tear of right rotator cuff, unspecified tear extent      Meds ordered this encounter  Medications  . HYDROcodone-acetaminophen (NORCO) 5-325 MG tablet    Sig: Take 1 tablet by mouth every 8 (eight) hours as needed for moderate pain.    Dispense:  21 tablet    Refill:  0     We discussed the situation she would like to continue with physical therapy at this time we'll see her again in 6 weeks he can continue ibuprofen and hydrocodone for pain  Arther Abbott, MD 07/13/2017 2:04 PM

## 2017-08-24 ENCOUNTER — Ambulatory Visit: Payer: 59 | Admitting: Orthopedic Surgery

## 2017-09-11 ENCOUNTER — Telehealth: Payer: Self-pay | Admitting: Orthopedic Surgery

## 2017-09-11 NOTE — Telephone Encounter (Signed)
Call received from patient's insurer, [employer] - per Ivan Croft, ph# [phone], ext [phone]. States she follows up with patient's specialists' offices, as she does not currently have a primary care doctor. Requested that we keep her name and contact phone# on file in case our office, or patient, may need to reach her directly.

## 2017-11-06 ENCOUNTER — Other Ambulatory Visit: Payer: Self-pay | Admitting: Physician Assistant

## 2017-11-06 DIAGNOSIS — Z1231 Encounter for screening mammogram for malignant neoplasm of breast: Secondary | ICD-10-CM

## 2017-12-04 ENCOUNTER — Ambulatory Visit
Admission: RE | Admit: 2017-12-04 | Discharge: 2017-12-04 | Disposition: A | Discharge status: Home / Self Care | Payer: 59 | Source: Ambulatory Visit | Attending: Physician Assistant | Admitting: Physician Assistant

## 2017-12-04 DIAGNOSIS — Z1231 Encounter for screening mammogram for malignant neoplasm of breast: Secondary | ICD-10-CM

## 2018-09-30 IMAGING — RF DG FLUORO GUIDE NDL PLC/BX
1 series · 1 of 1 positions shown · non-contrast
Comparison: none

CLINICAL DATA: RIGHT shoulder pain for several months, for CT
arthrography

[Series 1: cp_standard · 0.19mm/px · 1 of 1 slices shown]
[im 1/1]
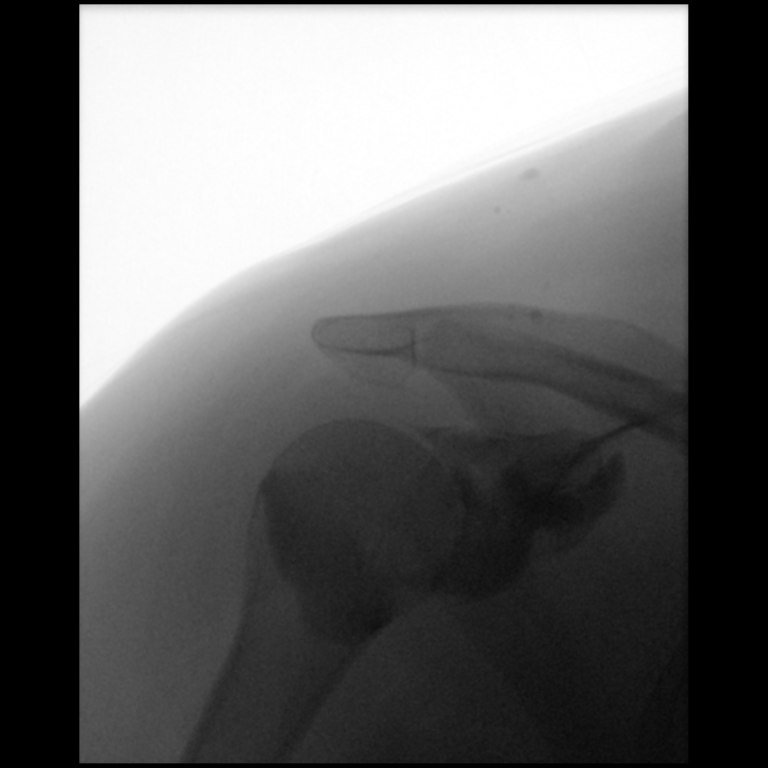

[1 of 1 positions shown; findings below may reference images not displayed]

EXAM:
RIGHT SHOULDER INJECTION UNDER FLUOROSCOPY

FLUOROSCOPY TIME:  Fluoroscopy Time:  0 minutes 48 seconds

Radiation Exposure Index (if provided by the fluoroscopic device):
3.3 mGy

Number of Acquired Spot Images: Single screen capture during
fluoroscopy

PROCEDURE:
Procedure, risks, benefits and alternatives explained to the
patient.

Patient's questions answered.

Written informed consent obtained.

Timeout protocol followed.

RIGHT shoulder joint localized by fluoroscopy.

Skin prepped and draped in usual sterile fashion.

Skin and soft tissues anesthetized with 5 mL of 1% lidocaine.

Under fluoroscopic guidance, 22 gauge spinal needle was advanced
into RIGHT shoulder joint.

10 mL of a solution consisting of 5 mL of Bsovue-EYY and 10 mL of
sterile saline was injected into the RIGHT shoulder joint without
difficulty.

Procedure tolerated well by patient without immediate complication.
IMPRESSION: RIGHT shoulder joint injection for CT arthrography.

## 2018-12-15 ENCOUNTER — Ambulatory Visit: Payer: 59 | Admitting: Diagnostic Neuroimaging

## 2019-04-25 ENCOUNTER — Other Ambulatory Visit: Payer: Self-pay

## 2019-04-25 ENCOUNTER — Ambulatory Visit (INDEPENDENT_AMBULATORY_CARE_PROVIDER_SITE_OTHER): Payer: 59 | Admitting: Neurology

## 2019-04-25 ENCOUNTER — Telehealth: Payer: Self-pay | Admitting: Neurology

## 2019-04-25 ENCOUNTER — Encounter: Payer: Self-pay | Admitting: Neurology

## 2019-04-25 DIAGNOSIS — R202 Paresthesia of skin: Secondary | ICD-10-CM

## 2019-04-25 NOTE — Progress Notes (Signed)
     Virtual Visit via Video Note  I connected with [name] on 04/25/19 at  2:00 PM EDT by a video enabled telemedicine application and verified that I am speaking with the correct person using two identifiers.  Location: Patient: The patient is at home. Provider: The physician in office.   I discussed the limitations of evaluation and management by telemedicine and the availability of in person appointments. The patient expressed understanding and agreed to proceed.  History of Present Illness: [name] is a 53 year old right-handed white female with a history of bipolar disorder.  The patient is sent to the office for problems with numbness and tingling sensations in both hands that has been present for 6 to 12 months but is gradually getting worse.  The patient is noting that the numbness may wake her up at night, she has difficulty with using a cash register or typing on a computer, these activities will also bring on the numbness.  The referring physician indicated that she was having trouble with weakness in the legs and difficulty with speaking and difficulty with memory.  The patient herself denies any of these problems.  The patient has fractured the legs and hurt her ankle in the past, at times she feels somewhat weak in the legs because of this.  She currently denies any neck pain or pain down the arms.  She denies any numbness in the feet.  She has not had any trouble with balance or difficulty controlling the bowels or the bladder.   Observations/Objective: The virtual evaluation reveals that the patient is alert and cooperative.  She has a normal speech pattern, no aphasia or dysarthria is noted.  Extraocular movements are full.  She is able to protrude the tongue in the midline with good lateral movement of the tongue.  Range of movement the cervical spine is full.  She has good finger-nose-finger and heel shin bilaterally.  Gait is normal.  Tandem gait is normal.   Romberg is negative.  The patient reports that when she taps the wrist on the right with her left hand, she does get tingling into the right hand.  This is not the case with the left hand.  Assessment and Plan: 1.  Probable bilateral carpal tunnel syndrome  The patient is to get wrist splints for the wrists bilaterally and to wear them as much she can do a night over the next 6 to 8 weeks.  The patient will be set up for EMG and nerve conduction study evaluation to look for carpal tunnel syndrome.  She will return to this office for the study.  Follow Up Instructions: Follow-up for EMG nerve conduction study evaluation.   I discussed the assessment and treatment plan with the patient. The patient was provided an opportunity to ask questions and all were answered. The patient agreed with the plan and demonstrated an understanding of the instructions.   The patient was advised to call back or seek an in-person evaluation if the symptoms worsen or if the condition fails to improve as anticipated.  I provided 30 minutes of non-face-to-face time during this encounter.   Kathrynn Ducking, MD

## 2019-04-25 NOTE — Telephone Encounter (Signed)
Pt called in and requested Video Visit for her visit today due to having a cough , email sent and confirmed she received , pt also provided consent to file insurance

## 2019-04-25 NOTE — Telephone Encounter (Signed)
Noted  

## 2019-06-13 ENCOUNTER — Other Ambulatory Visit: Payer: Self-pay | Admitting: Physician Assistant

## 2019-06-13 DIAGNOSIS — Z1231 Encounter for screening mammogram for malignant neoplasm of breast: Secondary | ICD-10-CM

## 2019-06-29 ENCOUNTER — Ambulatory Visit (INDEPENDENT_AMBULATORY_CARE_PROVIDER_SITE_OTHER): Payer: Medicare HMO | Admitting: Neurology

## 2019-06-29 ENCOUNTER — Encounter: Payer: Self-pay | Admitting: Neurology

## 2019-06-29 ENCOUNTER — Other Ambulatory Visit: Payer: Self-pay

## 2019-06-29 DIAGNOSIS — G5603 Carpal tunnel syndrome, bilateral upper limbs: Secondary | ICD-10-CM

## 2019-06-29 DIAGNOSIS — G479 Sleep disorder, unspecified: Secondary | ICD-10-CM

## 2019-06-29 DIAGNOSIS — R202 Paresthesia of skin: Secondary | ICD-10-CM

## 2019-06-29 NOTE — Procedures (Signed)
     HISTORY:  [name] is a 53 year old patient with a history of bilateral hand numbness and dysesthesias, discomfort.  The patient denies any neck pain or pain down the arms.  She is being evaluated for possible neuropathy or a cervical radiculopathy.  NERVE CONDUCTION STUDIES:  Nerve conduction studies were performed on both upper extremities.  The distal motor latency for the left median nerve was borderline normal and was within normal limits on the right.  The distal motor latencies for the ulnar nerves were normal bilaterally.  The motor amplitudes for the median and ulnar nerves were normal bilaterally, as were the nerve conduction velocities for these nerves.  The sensory latencies for the median nerves were prolonged bilaterally, normal for the ulnar nerves bilaterally.  The F-wave latencies for the ulnar nerves were within normal limits bilaterally.  EMG STUDIES:  EMG study was performed on the right upper extremity:  The first dorsal interosseous muscle reveals 2 to 4 K units with full recruitment. No fibrillations or positive waves were noted. The abductor pollicis brevis muscle reveals 2 to 4 K units with full recruitment. No fibrillations or positive waves were noted. The extensor indicis proprius muscle reveals 1 to 3 K units with full recruitment. No fibrillations or positive waves were noted. The pronator teres muscle reveals 2 to 3 K units with full recruitment. No fibrillations or positive waves were noted. The biceps muscle reveals 1 to 2 K units with full recruitment. No fibrillations or positive waves were noted. The triceps muscle reveals 2 to 4 K units with full recruitment. No fibrillations or positive waves were noted. The anterior deltoid muscle reveals 2 to 3 K units with full recruitment. No fibrillations or positive waves were noted. The cervical paraspinal muscles were tested at 2 levels. No abnormalities of insertional activity were seen at either level  tested. There was good relaxation.   IMPRESSION:  Nerve conduction studies done on both upper extremities shows evidence of borderline carpal tunnel syndrome bilaterally.  EMG evaluation of the right upper extremity was unremarkable, no evidence of an overlying cervical radiculopathy was seen.  Jill Alexanders MD 06/29/2019 1:29 PM  [facility] [address] [place], [place] [address]  Phone [phone] Fax [phone]

## 2019-06-29 NOTE — Progress Notes (Signed)
Please refer to EMG and nerve conduction procedure note.  

## 2019-06-29 NOTE — Progress Notes (Signed)
The patient comes in for EMG nerve conduction study today, she has been wearing wrist splints on both hands.  She still has ongoing discomfort with paresthesias and numbness.  Nerve conductions show evidence of a borderline carpal tunnel syndrome bilaterally.  EMG of the right arm was unremarkable.  The patient will be referred to Dr. Fredna Dow for further evaluation, it is possible that injections of the nerves may be beneficial.

## 2019-07-13 ENCOUNTER — Other Ambulatory Visit: Payer: Self-pay

## 2019-07-13 ENCOUNTER — Ambulatory Visit
Admission: RE | Admit: 2019-07-13 | Discharge: 2019-07-13 | Disposition: A | Discharge status: Home / Self Care | Payer: 59 | Source: Ambulatory Visit | Attending: Physician Assistant | Admitting: Physician Assistant

## 2019-07-13 DIAGNOSIS — Z1231 Encounter for screening mammogram for malignant neoplasm of breast: Secondary | ICD-10-CM

## 2019-07-22 DIAGNOSIS — G5603 Carpal tunnel syndrome, bilateral upper limbs: Secondary | ICD-10-CM | POA: Insufficient documentation

## 2019-11-27 ENCOUNTER — Ambulatory Visit
Admission: EM | Admit: 2019-11-27 | Discharge: 2019-11-27 | Disposition: A | Discharge status: Home / Self Care | Payer: Medicare HMO | Attending: Emergency Medicine | Admitting: Emergency Medicine

## 2019-11-27 ENCOUNTER — Other Ambulatory Visit: Payer: Self-pay

## 2019-11-27 DIAGNOSIS — S61211A Laceration without foreign body of left index finger without damage to nail, initial encounter: Secondary | ICD-10-CM | POA: Diagnosis not present

## 2019-11-27 DIAGNOSIS — W260XXA Contact with knife, initial encounter: Secondary | ICD-10-CM

## 2019-11-27 NOTE — ED Triage Notes (Signed)
Pt presents to UC w/ laceration on left hand at first finger joint which occurred today. Pt was cutting an onion w/ a kitchen knife and the knife slipped.

## 2019-11-27 NOTE — Discharge Instructions (Signed)
Finger splint applied; avoid excessive flexion of the finger for the next few days Bandage applied Keep covered for next and dry for next 24-48 hours.  After then you may gently clean with warm water and mild soap.  Avoid submerging wound in water. Change dressing daily and apply a thin layer of neosporin.  Return in 10 days to have sutures removed.   Take OTC ibuprofen or tylenol as needed for pain relief Return sooner or go to the ED if you have any new or worsening symptoms such as increased pain, redness, swelling, drainage, discharge, decreased range of motion of extremity, etc..

## 2019-11-27 NOTE — ED Provider Notes (Signed)
[place]   ED:[id number] 11/27/19 Arrival Time: [id number]  CC: LACERATION  SUBJECTIVE:  [name] is a 54 y.o. female who presents with a LT hand laceration that occurred 1 hour ago.  Symptoms began after cutting LT pointer finger knuckle while slicing onions.  Bleeding controlled.  Currently not on blood thinners.  Reports previous lacerations in the past.  Denies fever, chills, nausea, vomiting, redness, swelling, purulent drainage, decrease strength or sensation.   Td UTD: Yes.  ROS: As per HPI.  All other pertinent ROS negative.     Past Medical History:  Diagnosis Date  . Anxiety   . Arthritis   . ASCUS favor benign 09/2014, 03/2015   negative high risk HPV 09/2014  . Broken ankle   . Depression   . Enlarged thyroid   . GERD (gastroesophageal reflux disease)   . Heart murmur   . HTN (hypertension)    Past Surgical History:  Procedure Laterality Date  . ABDOMINAL HYSTERECTOMY  1993   endometriosis with pain  . ANKLE FRACTURE SURGERY Right   . Neuro stimulator implant    . orif lt wrist    . TUBAL LIGATION  1997   No Known Allergies No current facility-administered medications on file prior to encounter.   Current Outpatient Medications on File Prior to Encounter  Medication Sig Dispense Refill  . ALPRAZolam (XANAX) 0.5 MG tablet Take 0.5 mg by mouth daily as needed. For anxiety    . buPROPion (WELLBUTRIN XL) 300 MG 24 hr tablet Take 300 mg by mouth every morning.    Marland Kitchen FLUoxetine (PROZAC) 40 MG capsule Take 40 mg by mouth daily.     . furosemide (LASIX) 40 MG tablet Take 40 mg by mouth daily. Reported on 11/12/2015    . HYDROcodone-acetaminophen (NORCO) 5-325 MG tablet Take 1 tablet by mouth every 8 (eight) hours as needed for moderate pain. 21 tablet 0  . ibuprofen (ADVIL,MOTRIN) 600 MG tablet Take 1 tablet (600 mg total) by mouth every 6 (six) hours as needed. 60 tablet 5  . metoprolol succinate (TOPROL-XL) 100 MG 24 hr tablet Take 1 tablet by mouth  daily.     Marland Kitchen omeprazole (PRILOSEC) 20 MG capsule Take 20 mg by mouth daily.    . potassium chloride (MICRO-K) 10 MEQ CR capsule Take 10 mEq by mouth daily.    . traMADol (ULTRAM) 50 MG tablet Take 50 mg by mouth every 6 (six) hours as needed. pain    . Turmeric 450 MG CAPS Take by mouth.    . zolpidem (AMBIEN) 5 MG tablet      Social History   Socioeconomic History  . Marital status: Married    Spouse name: Not on file  . Number of children: 2  . Years of education: Not on file  . Highest education level: Not on file  Occupational History  . Occupation: unemployed  Tobacco Use  . Smoking status: Former Smoker    Packs/day: 1.00    Years: 40.00    Pack years: 40.00    Types: Cigarettes    Quit date: 06/24/2011    Years since quitting: 8.4  . Smokeless tobacco: Never Used  . Tobacco comment: uses e-sig 01/08/16  Substance and Sexual Activity  . Alcohol use: Yes    Alcohol/week: 5.0 standard drinks    Types: 5 Standard drinks or equivalent per week    Comment: 2-3 beers a day  . Drug use: No  . Sexual activity: Yes  Birth control/protection: Surgical    Comment: HYST  Other Topics Concern  . Not on file  Social History Narrative  . Not on file   Social Determinants of Health   Financial Resource Strain:   . Difficulty of Paying Living Expenses: Not on file  Food Insecurity:   . Worried About Charity fundraiser in the Last Year: Not on file  . Ran Out of Food in the Last Year: Not on file  Transportation Needs:   . Lack of Transportation (Medical): Not on file  . Lack of Transportation (Non-Medical): Not on file  Physical Activity:   . Days of Exercise per Week: Not on file  . Minutes of Exercise per Session: Not on file  Stress:   . Feeling of Stress : Not on file  Social Connections:   . Frequency of Communication with Friends and Family: Not on file  . Frequency of Social Gatherings with Friends and Family: Not on file  . Attends Religious Services: Not on  file  . Active Member of Clubs or Organizations: Not on file  . Attends Archivist Meetings: Not on file  . Marital Status: Not on file  Intimate Partner Violence:   . Fear of Current or Ex-Partner: Not on file  . Emotionally Abused: Not on file  . Physically Abused: Not on file  . Sexually Abused: Not on file   Family History  Problem Relation Age of Onset  . Other Father        Brain tumor  . Heart disease Father   . Healthy Mother   . Breast cancer Neg Hx      OBJECTIVE:  Vitals:   11/27/19 1156  BP: (!) 167/83  Pulse: 62  Resp: 17  Temp: 97.8 F (36.6 C)  SpO2: 95%     General appearance: alert; no distress CV: Radial pulse 2+; cap refill < 2 seconds Skin: laceration of LT second digit over MCP joint; size: approx 1.5 cm, bleeding controlled; strength and sensation intact Psychological: alert and cooperative; normal mood and affect  Procedure: Verbal consent obtained. Patient provided with risks and alternatives to the procedure. Wound copiously irrigated with tap water at home then cleansed with betadine in office. Anesthetized with 4 mL of lidocaine without epinephrine. Wound carefully explored. No foreign body, tendon injury, or nonviable tissue were noted. Using sterile technique 1 interrupted and 1 horizontal 5-0 Prolene sutures were placed to reapproximate the wound. Patient tolerated procedure well. No complications. Minimal bleeding. Patient advised to look for and return for any signs of infection such as redness, swelling, discharge, or worsening pain. Return for suture removal in 10 days.  ASSESSMENT & PLAN:  1. Laceration of index finger of left hand without complication, initial encounter     No orders of the defined types were placed in this encounter.  Finger splint applied; avoid excessive flexion of the finger for the next few days Bandage applied Keep covered for next and dry for next 24-48 hours.  After then you may gently clean with  warm water and mild soap.  Avoid submerging wound in water. Change dressing daily and apply a thin layer of neosporin.  Return in 10 days to have sutures removed.   Take OTC ibuprofen or tylenol as needed for pain relief Return sooner or go to the ED if you have any new or worsening symptoms such as increased pain, redness, swelling, drainage, discharge, decreased range of motion of extremity, etc..  Reviewed expectations re: course of current medical issues. Questions answered. Outlined signs and symptoms indicating need for more acute intervention. Patient verbalized understanding. After Visit Summary given.   Lestine Box, PA-C 11/27/19 1241

## 2020-01-06 ENCOUNTER — Other Ambulatory Visit: Payer: Self-pay

## 2020-01-06 ENCOUNTER — Ambulatory Visit
Admission: EM | Admit: 2020-01-06 | Discharge: 2020-01-06 | Disposition: A | Discharge status: Home / Self Care | Payer: Medicare HMO

## 2020-06-13 ENCOUNTER — Encounter: Payer: Self-pay | Admitting: Orthopedic Surgery

## 2020-06-14 DIAGNOSIS — R7303 Prediabetes: Secondary | ICD-10-CM | POA: Insufficient documentation

## 2020-07-09 ENCOUNTER — Encounter: Payer: Self-pay | Admitting: Obstetrics and Gynecology

## 2020-07-09 ENCOUNTER — Ambulatory Visit (INDEPENDENT_AMBULATORY_CARE_PROVIDER_SITE_OTHER): Payer: Medicare HMO | Admitting: Obstetrics and Gynecology

## 2020-07-09 ENCOUNTER — Other Ambulatory Visit: Payer: Self-pay

## 2020-07-09 VITALS — BP 124/80 | Ht 66.0 in | Wt 183.0 lb

## 2020-07-09 DIAGNOSIS — A630 Anogenital (venereal) warts: Secondary | ICD-10-CM | POA: Insufficient documentation

## 2020-07-09 DIAGNOSIS — R87622 Low grade squamous intraepithelial lesion on cytologic smear of vagina (LGSIL): Secondary | ICD-10-CM | POA: Diagnosis not present

## 2020-07-09 DIAGNOSIS — R87811 Vaginal high risk human papillomavirus (HPV) DNA test positive: Secondary | ICD-10-CM | POA: Diagnosis not present

## 2020-07-09 NOTE — Progress Notes (Signed)
[name] 16-Apr-1966 [id number]  SUBJECTIVE:  54 y.o. G2 P2 female referred by her primary care provider for an abnormal Pap smear.  She has had a prior hysterectomy about 30 years ago for endometriosis, she thinks she has retained at least one ovary.  1 prior vaginal delivery, 1 C-section.  She says that other than 2 years ago she has never had any abnormal Pap smears.  I do not have any records from previous encounters with gynecology but she says that she was told to "not worry about" in regard to the abnormal Pap smear.  Her Pap smear 06/19/2020 was low-grade squamous intraepithelial lesion cytology, high risk HPV positive,  positive for type 16, negative for type 18.  Also, she does have a few lesions on the external vulvar area that she is concerned about. She is smoking and trying to quit again.  Current Outpatient Medications  Medication Sig Dispense Refill  . ALPRAZolam (XANAX) 0.5 MG tablet Take 0.5 mg by mouth at bedtime as needed for anxiety.    . furosemide (LASIX) 40 MG tablet Take 40 mg by mouth.    . Multiple Vitamin (MULTIVITAMIN) tablet Take 1 tablet by mouth daily.    Marland Kitchen omeprazole (PRILOSEC) 20 MG capsule Take 20 mg by mouth daily.    . traMADol (ULTRAM) 50 MG tablet Take by mouth every 6 (six) hours as needed.    . zolpidem (AMBIEN) 10 MG tablet Take 10 mg by mouth at bedtime as needed for sleep.     No current facility-administered medications for this visit.   Allergies: Patient has no allergy information on record.  No LMP recorded.  Past medical history,surgical history, problem list, medications, allergies, family history and social history were all reviewed and documented as reviewed in the EPIC chart.    OBJECTIVE:  Ht 5\' 6"  (1.676 m)   Wt 183 lb (83 kg)   BMI 29.54 kg/m  The patient appears well, alert, oriented, in no distress. PELVIC EXAM: VULVA: normal appearing vulva with no masses, tenderness or lesions, at the midline posterior fourchette there  are 2-3 tiny condylomatous lesions that do a stain acetowhite, consistent with condyloma acuminata, 1 similar lesion just to the left of midline the posterior labia minora, VAGINA: normal appearing vagina with normal color and discharge, no lesions, CERVIX: normal appearing cervix without discharge or lesions, surgically absent, UTERUS: surgically absent, vaginal cuff grossly normal  Procedure notes 1. Vaginal colposcopy Graves speculum was used to visualize the vagina and a dilute acetic acid is applied followed by Lugol solution.  There is an area of nonstaining epithelium at the midline of the vaginal cuff just anterior of the center of the area.  Lugol solution is applied here and the tissue does not take it up and remains whitish without classic appearance of acetowhite change, however.  Close inspection of the vaginal sidewalls and the anterior and posterior vaginal epithelium do not indicate any other lesions or acetowhite change. The identified area of nonstaining tissue is injected with 0.5 mL of 1% lidocaine and a Tischler biopsy forcep is used to take a sample from the area.  The patient tolerated procedure well.  2.  Genital wart treatment 80% trichloroacetic acid was dabbed onto the identified vulvar genital warts causing the lesions to blanch.  There was no dripping of the solution outside of the defined treatment area.  The patient tolerated the procedure well.  Chaperone: Caryn Bee present during the examination and procedure  ASSESSMENT:  54 y.o. G2P2 here for vaginal colposcopy due to LGSIL vaginal Pap smear, and treatment of genital warts  PLAN:  1.  Further management will be dictated by the results of today's biopsy.  We discussed the role of HPV in mediating dysplastic changes of the vaginal and vulvar tissue.  Strongest correlate a risk factor for persistence of HPV and/or progression of HPV mediated diseases of the tissues is smoking.  She has quit smoking before and will be  working on stopping again after having gone through a stressful period of her life. 2.  Genital wart treatment.  We discussed repeated application of TCA may be necessary and if she notices the lesions are still intact the next week she should come back for repeat treatment.  We discussed that barrier contraceptives such as condoms are the only way to prevent spread to sexual partners and encouraged their use.  All questions were answered by the end of today's visit.  Joseph Pierini MD 07/09/20

## 2020-07-11 LAB — PATHOLOGY REPORT

## 2020-07-11 LAB — TISSUE SPECIMEN

## 2020-08-16 ENCOUNTER — Telehealth: Payer: Self-pay | Admitting: Orthopedic Surgery

## 2020-08-16 ENCOUNTER — Other Ambulatory Visit: Payer: Self-pay

## 2020-08-16 ENCOUNTER — Other Ambulatory Visit: Payer: Self-pay | Admitting: Orthopedic Surgery

## 2020-08-16 ENCOUNTER — Ambulatory Visit: Payer: Medicare HMO

## 2020-08-16 ENCOUNTER — Ambulatory Visit (INDEPENDENT_AMBULATORY_CARE_PROVIDER_SITE_OTHER): Payer: Medicare HMO | Admitting: Orthopedic Surgery

## 2020-08-16 VITALS — BP 146/88 | HR 82 | Ht 67.0 in | Wt 185.0 lb

## 2020-08-16 DIAGNOSIS — M25511 Pain in right shoulder: Secondary | ICD-10-CM | POA: Diagnosis not present

## 2020-08-16 DIAGNOSIS — G8929 Other chronic pain: Secondary | ICD-10-CM

## 2020-08-16 MED ORDER — HYDROCODONE-ACETAMINOPHEN 5-325 MG PO TABS: 1.0000 | ORAL_TABLET | Freq: Every day | ORAL | 0 refills | Status: DC

## 2020-08-16 MED ORDER — HYDROCODONE-ACETAMINOPHEN 5-325 MG PO TABS: 1.0000 | ORAL_TABLET | Freq: Every day | ORAL | 0 refills | Status: AC

## 2020-08-16 NOTE — Telephone Encounter (Signed)
If this doesn't work I m not doing another one

## 2020-08-16 NOTE — Telephone Encounter (Signed)
Patient was unhappy and wanted [facility] [place] and not [facility] [place].   I have called [facility] [place] and cancelled the prescription.   Please send to [facility] [place].

## 2020-08-16 NOTE — Patient Instructions (Signed)
Home exercises 3 times a week  Continue Advil  Stop tramadol  Hydrocodone at night  Follow-up if no improvement after 8 weeks

## 2020-08-16 NOTE — Telephone Encounter (Signed)
Call received from [facility], per Claiborne Rigg, regarding pre-authorization of medication prescribed at today's visit. Please call [phone] / Reference# [id number]

## 2020-08-16 NOTE — Telephone Encounter (Signed)
Please resend for 5 d supply so will not require prior auth.

## 2020-08-16 NOTE — Progress Notes (Signed)
Meds ordered this encounter  Medications  . HYDROcodone-acetaminophen (NORCO/VICODIN) 5-325 MG tablet    Sig: Take 1 tablet by mouth at bedtime for 5 days.    Dispense:  5 tablet    Refill:  0

## 2020-08-16 NOTE — Progress Notes (Signed)
New patient but patient is well-known to [place] last seen 3 years ago she had a CT scan of her shoulder with contrast to evaluate her rotator cuff which showed a partial tear.  She has a bone stimulator in her back and could not have MRI  Chief Complaint  Patient presents with  . Shoulder Pain    Right shoulder pain, referred by Dr. Jimmye Norman.   54 year old female recently divorced she said she now works at [employer] she complains of pain and decreased range of motion in the right shoulder pain when she sleeps on it at night.  She received treatment for the partial cuff tear with an injection and NSAIDs not sure if she went to therapy she does not remember  She did well for a while but when she went back to work her pain started to increase in her motion is intermittently poor  She did receive IM injection from her primary care doctor  He sent the notes over for her wellness visit do not see any contributing factors other than she smokes  Review of systems is negative for numbness or tingling  She does get some pain in the neck radiating from the shoulder and not the other way around.  She denies coughing wheeziness or shortness of breath no chest pain does complain of some heart burn.  She is on tramadol does not get much relief from that seems to get more relief from ibuprofen  Past Medical History:  Diagnosis Date  . Anxiety   . Arthritis   . ASCUS favor benign 09/2014, 03/2015   negative high risk HPV 09/2014  . Broken ankle   . Depression   . Enlarged thyroid   . GERD (gastroesophageal reflux disease)   . Heart murmur   . HTN (hypertension)    Past Surgical History:  Procedure Laterality Date  . ABDOMINAL HYSTERECTOMY  1993   endometriosis with pain  . ABDOMINAL HYSTERECTOMY    . ANKLE FRACTURE SURGERY Right   . ANKLE SURGERY    . CESAREAN SECTION    . Neuro stimulator implant    . orif lt wrist    . TUBAL LIGATION  1997  . TUBAL LIGATION     BP (!) 146/88   Pulse  82   Ht 5\' 7"  (1.702 m)   Wt 185 lb (83.9 kg)   BMI 28.98 kg/m   Well-developed well-nourished grooming hygiene normal adequate weight and body habitus  Gait is noncontributory  Right shoulder is tender in the deltoid no biceps tenderness normal external rotation with the arm at the side she has good strength in abduction with the elbow bent as well as straight she has pain in the front of the shoulder with internal rotation and has no pain when reaching across the chest  She has positive impingement sign  We did a lidocaine injection test with steroid  Post lidocaine injection test she seemed to be stronger with better range of motion in her pain changed to only having pain at terminal flexion  Report indicates prior CT did show partial undersurface cuff tear  Repeat imaging today showed normal shoulder  CT arthrogram CT arthrogram July 2018  IMPRESSION: 1. Subtle partial-thickness rim rent tear of the anterior aspect of the distal supraspinous tendon. 2. Small posterosuperior labral tear.   Electronically Signed   By: Lorriane Shire M.D.   On: 05/25/2017 11:40  Recommend Codman exercises and Thera-Band's  Hydrocodone 1 at night continue Advil follow-up if  no improvement after 8 weeks  Meds ordered this encounter  Medications  . HYDROcodone-acetaminophen (NORCO/VICODIN) 5-325 MG tablet    Sig: Take 1 tablet by mouth at bedtime.    Dispense:  30 tablet    Refill:  0

## 2020-11-26 ENCOUNTER — Emergency Department (HOSPITAL_COMMUNITY): Payer: Medicare HMO

## 2020-11-26 ENCOUNTER — Encounter (HOSPITAL_COMMUNITY): Payer: Self-pay | Admitting: Emergency Medicine

## 2020-11-26 ENCOUNTER — Emergency Department (HOSPITAL_COMMUNITY)
Admission: EM | Admit: 2020-11-26 | Discharge: 2020-11-26 | Disposition: A | Discharge status: Home / Self Care | Payer: Medicare HMO | Attending: Emergency Medicine | Admitting: Emergency Medicine

## 2020-11-26 ENCOUNTER — Other Ambulatory Visit: Payer: Self-pay

## 2020-11-26 DIAGNOSIS — F17210 Nicotine dependence, cigarettes, uncomplicated: Secondary | ICD-10-CM | POA: Diagnosis not present

## 2020-11-26 DIAGNOSIS — M25559 Pain in unspecified hip: Secondary | ICD-10-CM | POA: Diagnosis not present

## 2020-11-26 DIAGNOSIS — M25571 Pain in right ankle and joints of right foot: Secondary | ICD-10-CM | POA: Diagnosis not present

## 2020-11-26 DIAGNOSIS — M545 Low back pain, unspecified: Secondary | ICD-10-CM | POA: Insufficient documentation

## 2020-11-26 DIAGNOSIS — Z79899 Other long term (current) drug therapy: Secondary | ICD-10-CM | POA: Diagnosis not present

## 2020-11-26 DIAGNOSIS — I10 Essential (primary) hypertension: Secondary | ICD-10-CM | POA: Diagnosis not present

## 2020-11-26 DIAGNOSIS — G8929 Other chronic pain: Secondary | ICD-10-CM | POA: Insufficient documentation

## 2020-11-26 MED ORDER — OXYCODONE-ACETAMINOPHEN 5-325 MG PO TABS: 1.0000 | ORAL_TABLET | ORAL | 0 refills | Status: DC | PRN

## 2020-11-26 MED ORDER — OXYCODONE-ACETAMINOPHEN 5-325 MG PO TABS
1.0000 | ORAL_TABLET | Freq: Once | ORAL | Status: AC
  Administered 2020-11-26: 1 via ORAL
  Filled 2020-11-26: qty 1

## 2020-11-26 NOTE — ED Provider Notes (Signed)
[facility] EMERGENCY DEPARTMENT Provider Note   CSN: [id number] Arrival date & time: 11/26/20  1100     History Chief Complaint  Patient presents with   Hip Pain    [name] is a 55 y.o. female with history of spinal stimulator chronic pain in her right ankle, who presents with new onset of pain over her hardware site in her back since yesterday.  Patient states that she woke up and noticed that her back was extremely sensitive to the touch over the area of her stimulator.  She states that the battery has been dead for several years, however she has not pursued removal as it has not been giving her any issues.  Patient states that when the stimulator was placed she was approximate 100 pounds heavier than she is today, could not initially see the stimulator, now there is a clear outline protruding from her skin.  She states that the pain does not radiate anywhere, but she is feeling worsening pain in her right ankle where she had another lead from her stimulator.  She denies fevers or chills, denies recent trauma, or new activities.  Denies any problems with her stimulator in the past.  States she called her primary care doctor today and they told her to come to the emergency department.  She was unable to contact the provider who placed the stimulator.  I personally viewed the patient's medical history she has history of arthritis, anxiety and depression, hypertension, GERD, right ankle ORIF with subsequent neurostimulator implant due to ankle pain.  HPI     Past Medical History:  Diagnosis Date   Anxiety    Arthritis    ASCUS favor benign 09/2014, 03/2015   negative high risk HPV 09/2014   Broken ankle    Depression    Enlarged thyroid    GERD (gastroesophageal reflux disease)    Heart murmur    HTN (hypertension)     Patient Active Problem List   Diagnosis Date Noted   Genital warts 07/09/2020   Sleep disturbance 07/18/2015   Arthritis of ankle  09/07/2014   Ankle fracture, right 07/29/2014    Past Surgical History:  Procedure Laterality Date   ABDOMINAL HYSTERECTOMY  1993   endometriosis with pain   ABDOMINAL HYSTERECTOMY     ANKLE FRACTURE SURGERY Right    ANKLE SURGERY     CESAREAN SECTION     Neuro stimulator implant     orif lt wrist     TUBAL LIGATION  1997   TUBAL LIGATION       OB History    Gravida  4   Para  4   Term  0   Preterm  0   AB  0   Living        SAB  0   IAB  0   Ectopic  0   Multiple      Live Births              Family History  Problem Relation Age of Onset   Cancer Father        Brain tumor   Other Father        Brain tumor   Heart disease Father    Healthy Mother    Breast cancer Neg Hx     Social History   Tobacco Use   Smoking status: Current Every Day Smoker    Packs/day: 1.00    Types: Cigarettes   Smokeless tobacco: Never  Used   Tobacco comment: uses e-sig 01/08/16  Vaping Use   Vaping Use: Every day  Substance Use Topics   Alcohol use: Yes    Comment: Occas   Drug use: No    Home Medications Prior to Admission medications   Medication Sig Start Date End Date Taking? Authorizing Provider  ALPRAZolam Duanne Moron) 0.5 MG tablet Take 0.5 mg by mouth daily as needed. For anxiety 05/11/14   [provider]  ALPRAZolam Duanne Moron) 0.5 MG tablet Take 0.5 mg by mouth at bedtime as needed for anxiety.    [provider]  buPROPion (WELLBUTRIN XL) 300 MG 24 hr tablet Take 300 mg by mouth every morning. Patient not taking: Reported on 08/16/2020 06/05/14   [provider]  Cyanocobalamin (VITAMIN B 12 PO) Take by mouth. Patient not taking: Reported on 08/16/2020    [provider]  FLUoxetine (PROZAC) 40 MG capsule Take 40 mg by mouth daily.  Patient not taking: Reported on 08/16/2020    [provider]  furosemide (LASIX) 40 MG tablet Take 40 mg by mouth daily. Reported on 11/12/2015 03/20/15   [provider]  furosemide (LASIX) 40 MG tablet Take 40 mg by mouth. Patient not taking: Reported on 08/16/2020    [provider]  ibuprofen (ADVIL,MOTRIN) 600 MG tablet Take 1 tablet (600 mg total) by mouth every 6 (six) hours as needed. 07/27/14   Carole Civil, MD  metoprolol succinate (TOPROL-XL) 100 MG 24 hr tablet Take 1 tablet by mouth daily.  Patient not taking: Reported on 08/16/2020 12/18/15   [provider]  Multiple Vitamin (MULTIVITAMIN) tablet Take 1 tablet by mouth daily.    [provider]  omeprazole (PRILOSEC) 20 MG capsule Take 20 mg by mouth daily.    [provider]  omeprazole (PRILOSEC) 20 MG capsule Take 20 mg by mouth daily.    [provider]  potassium chloride (MICRO-K) 10 MEQ CR capsule Take 10 mEq by mouth daily. Patient not taking: Reported on 08/16/2020 03/20/15   [provider]  traMADol (ULTRAM) 50 MG tablet Take 50 mg by mouth every 6 (six) hours as needed. pain 03/19/15   [provider]  traMADol (ULTRAM) 50 MG tablet Take by mouth every 6 (six) hours as needed. Patient not taking: Reported on 08/16/2020    [provider]  Turmeric 450 MG CAPS Take by mouth. Patient not taking: Reported on 08/16/2020    [provider]  zolpidem (AMBIEN) 10 MG tablet Take 10 mg by mouth at bedtime as needed for sleep. Patient not taking: Reported on 08/16/2020    [provider]  zolpidem (AMBIEN) 5 MG tablet  01/14/16   [provider]    Allergies    Patient has no known allergies.  Review of Systems   Review of Systems  Constitutional: Negative.   HENT: Negative.   Respiratory: Negative.   Cardiovascular: Negative.   Gastrointestinal: Negative.   Genitourinary: Negative.   Musculoskeletal: Positive for arthralgias and back pain.  Skin: Negative.   Neurological: Negative.   Hematological: Negative.   Psychiatric/Behavioral: Negative.     Physical Exam Updated  Vital Signs BP 107/84 (BP Location: Right Arm)    Pulse 85    Temp 97.7 F (36.5 C) (Oral)    Resp 18    Ht 5\' 7"  (1.702 m)    Wt 79.4 kg    SpO2 100%    BMI 27.41 kg/m   Physical Exam Vitals  and nursing note reviewed.  HENT:     Head: Normocephalic and atraumatic.     Nose: Nose normal.     Mouth/Throat:     Mouth: Mucous membranes are moist.     Pharynx: Uvula midline. No oropharyngeal exudate or posterior oropharyngeal erythema.     Tonsils: No tonsillar exudate.  Eyes:     General: Lids are normal.        Right eye: No discharge.        Left eye: No discharge.     Extraocular Movements: Extraocular movements intact.     Conjunctiva/sclera: Conjunctivae normal.     Pupils: Pupils are equal, round, and reactive to light.  Neck:     Trachea: Trachea and phonation normal.  Cardiovascular:     Rate and Rhythm: Normal rate and regular rhythm.     Pulses: Normal pulses.          Radial pulses are 2+ on the right side and 2+ on the left side.       Dorsalis pedis pulses are 2+ on the right side and 2+ on the left side.     Heart sounds: Normal heart sounds. No murmur heard.   Pulmonary:     Effort: Pulmonary effort is normal. No bradypnea, accessory muscle usage, prolonged expiration or respiratory distress.     Breath sounds: Normal breath sounds. No wheezing or rales.  Chest:     Chest wall: No deformity, swelling, tenderness, crepitus or edema.  Abdominal:     General: Bowel sounds are normal. There is no distension.     Palpations: Abdomen is soft.     Tenderness: There is no abdominal tenderness. There is no guarding or rebound.  Musculoskeletal:        General: No deformity.     Cervical back: Normal and neck supple. No rigidity, tenderness or crepitus. No pain with movement, spinous process tenderness or muscular tenderness.     Thoracic back: Normal. No spasms, tenderness or bony tenderness.     Lumbar back: Tenderness present. No spasms or bony tenderness. Negative  right straight leg raise test and negative left straight leg raise test.       Back:     Right hip: Normal.     Left hip: Normal.     Right upper leg: Normal.     Left upper leg: Normal.     Right knee: Normal.     Left knee: Normal.     Right lower leg: Normal. No edema.     Left lower leg: Normal. No edema.     Right ankle: No swelling or deformity. Tenderness present. Normal range of motion.     Right Achilles Tendon: Normal.     Left ankle: Normal.     Left Achilles Tendon: Normal.     Right foot: Normal.     Left foot: Normal.       Legs:     Comments: 5/5 strength in plantar and dorsiflexion of feet bilaterally.  Patient is neurovascularly intact in lower extremities bilaterally.  Lymphadenopathy:     Cervical: No cervical adenopathy.  Skin:    General: Skin is warm and dry.     Capillary Refill: Capillary refill takes less than 2 seconds.  Neurological:     General: No focal deficit present.     Mental Status: She is alert and oriented to person, place, and time. Mental status is at baseline.     Sensory: Sensation is intact.  Motor: Motor function is intact.     Gait: Gait is intact.  Psychiatric:        Mood and Affect: Mood normal.     ED Results / Procedures / Treatments   Labs (all labs ordered are listed, but only abnormal results are displayed) Labs Reviewed - No data to display  EKG None  Radiology DG Lumbar Spine Complete  Result Date: 11/26/2020 CLINICAL DATA:  Lumbosacral back pain on the right since yesterday. Spinal stimulator in place. EXAM: LUMBAR SPINE - COMPLETE 4+ VIEW COMPARISON:  Most recent radiograph 11/12/2015 FINDINGS: The alignment is maintained. Vertebral body heights are normal. There is no listhesis. The posterior elements are intact. Disc space narrowing and endplate spurring at T5-V7. The remaining disc spaces are preserved. No fracture. Sacroiliac joints are symmetric and normal. Spinal stimulator with battery pack on the right,  leads coursing into the spine, not included on this lumbar field of view. IMPRESSION: Degenerative disc disease at L2-L3. Spinal stimulator with leads coursing cranially, tip not included on this lumbar field of view. Electronically Signed   By: Keith Rake M.D.   On: 11/26/2020 17:49    Procedures Procedures (including critical care time)  Medications Ordered in ED Medications  oxyCODONE-acetaminophen (PERCOCET/ROXICET) 5-325 MG per tablet 1 tablet (1 tablet Oral Given 11/26/20 1728)    ED Course  I have reviewed the triage vital signs and the nursing notes.  Pertinent labs & imaging results that were available during my care of the patient were reviewed by me and considered in my medical decision making (see chart for details).    MDM Rules/Calculators/A&P                         55 year old female presents with concern for pain at the site of her spinal stimulator x 24 hours was placed in 2014.  Patient vital signs were normal on intake.  Physical exam was very reassuring.  Patient is neurovascularly intact in lower extremities bilaterally.  Exquisite tenderness to palpation over the superior lead wire coming off of her spinal stimulator battery pack.  No tenderness to palpation of the battery pack itself.  No erythema, warmth to touch, or induration over the spinal stimulator or the leads itself to suggest infection.  We will proceed with plain film of the lumbar spine for closer evaluation of the spinal stimulator.  Analgesia offered.  Plain film of the lumbar spine revealed degenerative disc disease at L2-L3.  Spinal stimulator with leads coursing cranially, tip not including lumbar field-of-view.  No evidence of fracture of the device within the body cavity.  Given reassuring physical exam and vital signs, no further work-up is warranted in the emergency department this time.  Recommend patient follow-up with surgeon who placed the device, will also provide contact information for  on-call spine surgeon should patient choose to pursue alternative route.  Emyah voiced understanding of her medical evaluation and treatment plan.  Her questions were answered to expressed satisfaction.  Return precautions given.  Patient is stable and appropriate for discharge at this time.  Final Clinical Impression(s) / ED Diagnoses Final diagnoses:  None    Rx / DC Orders ED Discharge Orders    None       Aura Dials 11/26/20 2336    Isla Pence, MD 11/27/20 0107

## 2020-11-26 NOTE — Discharge Instructions (Addendum)
You were evaluated in the emergency department today for your low back pain associated with your spinal stimulator lead.  Physical exam was reassuring-you are neurologically intact in your lower extremities bilaterally.  You are tender to palpation directly over one of the lead wires coming off of the battery pack of your spinal stimulator.  There is no redness swelling or other signs of infection. X-ray of your lumbar spine revealed degenerative disc disease intact and an spinal stimulator.  Recommend you follow-up with your surgeon who placed the stimulator; if you are not able to contact them, the contact information for [facility] is listed below.  Return to the emergency department if you develop any worsening back pain, fevers, chills, numbness, tingling, weakness in your extremities, or any other new severe symptoms.

## 2020-11-26 NOTE — ED Notes (Signed)
Pt ambulatory to er room number 24, pt c/o R back sciatica like pain going down her R leg, states that she has a hx of stimulator; however, the battery is dead and was also wondering if it would be possible to have it removed. Denies loss of bowel or bladder function.

## 2020-11-26 NOTE — ED Triage Notes (Addendum)
Pt to the ED with complaints of rt hip and lower back pain.  Pt has a spinal stimulator implant and states it is the source of her pain.  Pt is ambulatory

## 2020-12-12 ENCOUNTER — Other Ambulatory Visit: Payer: Self-pay | Admitting: Physician Assistant

## 2020-12-12 DIAGNOSIS — Z1231 Encounter for screening mammogram for malignant neoplasm of breast: Secondary | ICD-10-CM

## 2021-01-24 ENCOUNTER — Ambulatory Visit: Payer: Medicare HMO

## 2021-01-25 ENCOUNTER — Other Ambulatory Visit: Payer: Self-pay

## 2021-01-25 ENCOUNTER — Ambulatory Visit
Admission: RE | Admit: 2021-01-25 | Discharge: 2021-01-25 | Disposition: A | Discharge status: Home / Self Care | Payer: Medicare HMO | Source: Ambulatory Visit | Attending: Physician Assistant | Admitting: Physician Assistant

## 2021-01-25 DIAGNOSIS — Z1231 Encounter for screening mammogram for malignant neoplasm of breast: Secondary | ICD-10-CM

## 2021-05-15 ENCOUNTER — Encounter: Payer: Self-pay | Admitting: Emergency Medicine

## 2021-05-15 ENCOUNTER — Ambulatory Visit
Admission: EM | Admit: 2021-05-15 | Discharge: 2021-05-15 | Disposition: A | Discharge status: Home / Self Care | Payer: Medicare HMO | Attending: Family Medicine | Admitting: Family Medicine

## 2021-05-15 ENCOUNTER — Other Ambulatory Visit: Payer: Self-pay

## 2021-05-15 DIAGNOSIS — Z113 Encounter for screening for infections with a predominantly sexual mode of transmission: Secondary | ICD-10-CM | POA: Insufficient documentation

## 2021-05-15 DIAGNOSIS — Z114 Encounter for screening for human immunodeficiency virus [HIV]: Secondary | ICD-10-CM | POA: Diagnosis present

## 2021-05-15 NOTE — ED Triage Notes (Signed)
Pt's ex boyfriend told her he had genital herpes.  Pt states she does not have any s/s.

## 2021-05-15 NOTE — Discharge Instructions (Addendum)
Swab testing will be back in 2-3 days  Blood testing will also be back within that time frame  We will call with any positive results that require further attention  Do not have unprotected sex until results are back and negative or until treatment is completed  Follow up with this office or with primary care if symptoms are persisting.  Follow up in the ER for high fever, trouble swallowing, trouble breathing, other concerning symptoms.

## 2021-05-15 NOTE — ED Provider Notes (Signed)
[facility]    CSN: [id number] Arrival date & time: 05/15/21  0944      History   Chief Complaint Chief Complaint  Patient presents with   SEXUALLY TRANSMITTED DISEASE    HPI [name] is a 55 y.o. female.   Reports that her boyfriend told her that he has genital herpes. She has no history of this. Requesting STD screening. Denies vaginal lesions, sores, vaginal discharge/itching/burning/odor, abdominal pain, nausea, vomiting, diarrhea, rash, fever, other symptoms.  ROS per HPI  The history is provided by the patient.   Past Medical History:  Diagnosis Date   Anxiety    Arthritis    ASCUS favor benign 09/2014, 03/2015   negative high risk HPV 09/2014   Broken ankle    Depression    Enlarged thyroid    GERD (gastroesophageal reflux disease)    Heart murmur    HTN (hypertension)     Patient Active Problem List   Diagnosis Date Noted   Genital warts 07/09/2020   Sleep disturbance 07/18/2015   Arthritis of ankle 09/07/2014   Ankle fracture, right 07/29/2014    Past Surgical History:  Procedure Laterality Date   ABDOMINAL HYSTERECTOMY  1993   endometriosis with pain   ABDOMINAL HYSTERECTOMY     ANKLE FRACTURE SURGERY Right    ANKLE SURGERY     CESAREAN SECTION     Neuro stimulator implant     orif lt wrist     TUBAL LIGATION  1997   TUBAL LIGATION      OB History     Gravida  4   Para  4   Term  0   Preterm  0   AB  0   Living         SAB  0   IAB  0   Ectopic  0   Multiple      Live Births               Home Medications    Prior to Admission medications   Medication Sig Start Date End Date Taking? Authorizing Provider  ALPRAZolam Duanne Moron) 0.5 MG tablet Take 0.5 mg by mouth daily as needed. For anxiety 05/11/14   [provider]  ALPRAZolam Duanne Moron) 0.5 MG tablet Take 0.5 mg by mouth at bedtime as needed for anxiety.    [provider]  buPROPion (WELLBUTRIN XL) 300 MG 24 hr tablet Take  300 mg by mouth every morning. Patient not taking: Reported on 08/16/2020 06/05/14   [provider]  Cyanocobalamin (VITAMIN B 12 PO) Take by mouth. Patient not taking: Reported on 08/16/2020    [provider]  FLUoxetine (PROZAC) 40 MG capsule Take 40 mg by mouth daily.  Patient not taking: Reported on 08/16/2020    [provider]  furosemide (LASIX) 40 MG tablet Take 40 mg by mouth daily. Reported on 11/12/2015 03/20/15   [provider]  furosemide (LASIX) 40 MG tablet Take 40 mg by mouth. Patient not taking: Reported on 08/16/2020    [provider]  ibuprofen (ADVIL,MOTRIN) 600 MG tablet Take 1 tablet (600 mg total) by mouth every 6 (six) hours as needed. 07/27/14   Carole Civil, MD  metoprolol succinate (TOPROL-XL) 100 MG 24 hr tablet Take 1 tablet by mouth daily.  Patient not taking: Reported on 08/16/2020 12/18/15   [provider]  Multiple Vitamin (MULTIVITAMIN) tablet Take 1 tablet by mouth daily.    [provider]  omeprazole (PRILOSEC) 20 MG capsule Take 20 mg by mouth daily.    [provider]  omeprazole (PRILOSEC) 20 MG capsule Take 20 mg by mouth daily.    [provider]  oxyCODONE-acetaminophen (PERCOCET/ROXICET) 5-325 MG tablet Take 1 tablet by mouth every 4 (four) hours as needed for severe pain. 11/26/20   Sponseller, Eugene Garnet R, PA-C  potassium chloride (MICRO-K) 10 MEQ CR capsule Take 10 mEq by mouth daily. Patient not taking: Reported on 08/16/2020 03/20/15   [provider]  Turmeric 450 MG CAPS Take by mouth. Patient not taking: Reported on 08/16/2020    [provider]  zolpidem (AMBIEN) 10 MG tablet Take 10 mg by mouth at bedtime as needed for sleep. Patient not taking: Reported on 08/16/2020    [provider]  zolpidem (AMBIEN) 5 MG tablet  01/14/16   [provider]    Family History Family History  Problem Relation Age of Onset   Cancer Father         Brain tumor   Other Father        Brain tumor   Heart disease Father    Healthy Mother    Breast cancer Neg Hx     Social History Social History   Tobacco Use   Smoking status: Every Day    Packs/day: 1.00    Pack years: 0.00    Types: Cigarettes   Smokeless tobacco: Never   Tobacco comments:    uses e-sig 01/08/16  Vaping Use   Vaping Use: Every day  Substance Use Topics   Alcohol use: Yes    Comment: Occas   Drug use: No     Allergies   Patient has no known allergies.   Review of Systems Review of Systems   Physical Exam Triage Vital Signs ED Triage Vitals [05/15/21 1004]  Enc Vitals Group     BP (!) 145/90     Pulse Rate 77     Resp 19     Temp 97.8 F (36.6 C)     Temp Source Oral     SpO2 97 %     Weight      Height      Head Circumference      Peak Flow      Pain Score 0     Pain Loc      Pain Edu?      Excl. in [place]?    No data found.  Updated Vital Signs BP (!) 145/90 (BP Location: Right Arm)   Pulse 77   Temp 97.8 F (36.6 C) (Oral)   Resp 19   SpO2 97%   Visual Acuity Right Eye Distance:   Left Eye Distance:   Bilateral Distance:    Right Eye Near:   Left Eye Near:    Bilateral Near:     Physical Exam Vitals and nursing note reviewed.  Constitutional:      General: She is not in acute distress.    Appearance: Normal appearance. She is well-developed.  HENT:     Head: Normocephalic and atraumatic.     Nose: Nose normal.     Mouth/Throat:     Mouth: Mucous membranes are moist.     Pharynx: Oropharynx is clear.  Eyes:     Extraocular Movements: Extraocular movements intact.     Conjunctiva/sclera: Conjunctivae normal.     Pupils: Pupils are equal, round, and reactive to light.  Cardiovascular:     Rate and Rhythm: Normal  rate and regular rhythm.  Pulmonary:     Effort: Pulmonary effort is normal. No respiratory distress.  Genitourinary:    Comments: Declines  Musculoskeletal:        General: Normal range of  motion.     Cervical back: Normal range of motion and neck supple.  Skin:    General: Skin is warm and dry.     Capillary Refill: Capillary refill takes less than 2 seconds.  Neurological:     General: No focal deficit present.     Mental Status: She is alert and oriented to person, place, and time.  Psychiatric:        Mood and Affect: Mood normal.        Behavior: Behavior normal.        Thought Content: Thought content normal.     UC Treatments / Results  Labs (all labs ordered are listed, but only abnormal results are displayed) Labs Reviewed  HSV(HERPES SIMPLEX VRS) I + II AB-IGM  HIV ANTIBODY (ROUTINE TESTING W REFLEX)  RPR  CERVICOVAGINAL ANCILLARY ONLY    EKG   Radiology No results found.  Procedures Procedures (including critical care time)  Medications Ordered in UC Medications - No data to display  Initial Impression / Assessment and Plan / UC Course  I have reviewed the triage vital signs and the nursing notes.  Pertinent labs & imaging results that were available during my care of the patient were reviewed by me and considered in my medical decision making (see chart for details).    STD Screen HIV screen  Swab testing will be back in 2-3 days Blood testing will also be back within that time frame We will call with any positive results that require further attention Do not have unprotected sex until results are back and negative or until treatment is completed Follow up with this office or with primary care if symptoms are persisting. Follow up in the ER for high fever, trouble swallowing, trouble breathing, other concerning symptoms.  Final Clinical Impressions(s) / UC Diagnoses   Final diagnoses:  Screen for STD (sexually transmitted disease)  Encounter for screening for human immunodeficiency virus (HIV)     Discharge Instructions      Swab testing will be back in 2-3 days  Blood testing will also be back within that time frame  We  will call with any positive results that require further attention  Do not have unprotected sex until results are back and negative or until treatment is completed  Follow up with this office or with primary care if symptoms are persisting.  Follow up in the ER for high fever, trouble swallowing, trouble breathing, other concerning symptoms.       ED Prescriptions   None    PDMP not reviewed this encounter.   Faustino Congress, NP 05/15/21 1135

## 2021-05-16 LAB — CERVICOVAGINAL ANCILLARY ONLY
Bacterial Vaginitis (gardnerella): NEGATIVE
Candida Glabrata: NEGATIVE
Candida Vaginitis: NEGATIVE
Chlamydia: NEGATIVE
Comment: NEGATIVE
Comment: NEGATIVE
Comment: NEGATIVE
Comment: NEGATIVE
Comment: NEGATIVE
Comment: NORMAL
Neisseria Gonorrhea: NEGATIVE
Trichomonas: NEGATIVE

## 2021-05-17 LAB — RPR: RPR Ser Ql: NONREACTIVE

## 2021-05-17 LAB — HSV(HERPES SIMPLEX VRS) I + II AB-IGM: HSVI/II Comb IgM: <0.91 Ratio (ref 0.00–0.90)

## 2021-05-17 LAB — HIV ANTIBODY (ROUTINE TESTING W REFLEX): HIV Screen 4th Generation wRfx: NONREACTIVE

## 2021-06-12 ENCOUNTER — Other Ambulatory Visit: Payer: Self-pay | Admitting: Physician Assistant

## 2021-06-12 DIAGNOSIS — E7800 Pure hypercholesterolemia, unspecified: Secondary | ICD-10-CM | POA: Insufficient documentation

## 2021-06-12 DIAGNOSIS — Z720 Tobacco use: Secondary | ICD-10-CM

## 2021-07-01 ENCOUNTER — Encounter: Payer: Self-pay | Admitting: Gastroenterology

## 2021-07-02 ENCOUNTER — Inpatient Hospital Stay: Admission: RE | Admit: 2021-07-02 | Payer: Medicare HMO | Source: Ambulatory Visit

## 2021-07-12 ENCOUNTER — Ambulatory Visit (INDEPENDENT_AMBULATORY_CARE_PROVIDER_SITE_OTHER): Payer: Medicare HMO | Admitting: Obstetrics and Gynecology

## 2021-07-12 ENCOUNTER — Other Ambulatory Visit (HOSPITAL_COMMUNITY)
Admission: RE | Admit: 2021-07-12 | Discharge: 2021-07-12 | Disposition: A | Discharge status: Home / Self Care | Payer: Medicare HMO | Source: Ambulatory Visit | Attending: Obstetrics and Gynecology | Admitting: Obstetrics and Gynecology

## 2021-07-12 ENCOUNTER — Other Ambulatory Visit: Payer: Self-pay

## 2021-07-12 ENCOUNTER — Encounter: Payer: Self-pay | Admitting: Obstetrics and Gynecology

## 2021-07-12 VITALS — BP 138/80 | HR 60 | Ht 66.0 in | Wt 189.0 lb

## 2021-07-12 DIAGNOSIS — Z87411 Personal history of vaginal dysplasia: Secondary | ICD-10-CM

## 2021-07-12 DIAGNOSIS — Z113 Encounter for screening for infections with a predominantly sexual mode of transmission: Secondary | ICD-10-CM | POA: Diagnosis present

## 2021-07-12 DIAGNOSIS — Z1272 Encounter for screening for malignant neoplasm of vagina: Secondary | ICD-10-CM

## 2021-07-12 DIAGNOSIS — A630 Anogenital (venereal) warts: Secondary | ICD-10-CM | POA: Diagnosis not present

## 2021-07-12 DIAGNOSIS — Z9189 Other specified personal risk factors, not elsewhere classified: Secondary | ICD-10-CM

## 2021-07-12 DIAGNOSIS — Z1151 Encounter for screening for human papillomavirus (HPV): Secondary | ICD-10-CM | POA: Insufficient documentation

## 2021-07-12 DIAGNOSIS — Z01411 Encounter for gynecological examination (general) (routine) with abnormal findings: Secondary | ICD-10-CM | POA: Diagnosis present

## 2021-07-12 MED ORDER — IMIQUIMOD 5 % EX CREA: TOPICAL_CREAM | CUTANEOUS | 2 refills | Status: DC

## 2021-07-12 NOTE — Progress Notes (Signed)
55 y.o. G37P0000 Divorced   White or Caucasian Unavailable Not Hispanic or Latino female here for annual exam.  H/o hysterectomy. Last year she had vaginal colposcopy for a LSIL pap with Dr Delilah Shan last year. Biopsy with koilocytic atypia. She was also treated for genital warts last year, states they didn't go away and they bother her   She is infrequently sexually active, same partner x 5-6 months. No dyspareunia.    She has had more than 5 sexual partners in her life  No LMP recorded. Patient has had a hysterectomy.          Sexually active: No.  The current method of family planning is none.    Smoker:  She quit 5 days ago.   Health Maintenance: Pap:  06/19/20 LSIL, +HPV; 03/28/15 ASCUS  History of abnormal Pap:  yes 07/11/20 Vaginal bx mild atypia not dysplasia or precancer tissue. MMG:  01/28/21 density C Bi-rads  BMD: never   Colonoscopy: 02/11/16 polyps  TDaP:  07/05/15  Gardasil: na   reports that she has quit smoking. Her smoking use included cigarettes. She smoked an average of 1 pack per day. She has never used smokeless tobacco. She reports current alcohol use. She reports that she does not use drugs. Couple of drinks a week. She is on disability.   Past Medical History:  Diagnosis Date   Anxiety    Arthritis    ASCUS favor benign 09/2014, 03/2015   negative high risk HPV 09/2014   Broken ankle    Depression    Enlarged thyroid    GERD (gastroesophageal reflux disease)    Heart murmur    HTN (hypertension)     Past Surgical History:  Procedure Laterality Date   ABDOMINAL HYSTERECTOMY  1993   endometriosis with pain   ABDOMINAL HYSTERECTOMY     ANKLE FRACTURE SURGERY Right    ANKLE SURGERY     CESAREAN SECTION     Neuro stimulator implant     orif lt wrist     TUBAL LIGATION  1997   TUBAL LIGATION      Current Outpatient Medications  Medication Sig Dispense Refill   ALPRAZolam (XANAX) 0.5 MG tablet Take 0.5 mg by mouth at bedtime as needed for anxiety.      buPROPion (WELLBUTRIN XL) 300 MG 24 hr tablet Take 300 mg by mouth every morning.     Cyanocobalamin (VITAMIN B 12 PO) Take by mouth.     FLUoxetine (PROZAC) 40 MG capsule Take 40 mg by mouth daily.     furosemide (LASIX) 40 MG tablet Take 40 mg by mouth daily. Reported on 11/12/2015     ibuprofen (ADVIL,MOTRIN) 600 MG tablet Take 1 tablet (600 mg total) by mouth every 6 (six) hours as needed. 60 tablet 5   Multiple Vitamin (MULTIVITAMIN) tablet Take 1 tablet by mouth daily.     omeprazole (PRILOSEC) 20 MG capsule Take 20 mg by mouth daily.     zolpidem (AMBIEN) 10 MG tablet Take 10 mg by mouth at bedtime as needed for sleep.     potassium chloride (MICRO-K) 10 MEQ CR capsule Take 10 mEq by mouth daily. (Patient not taking: No sig reported)     No current facility-administered medications for this visit.    Family History  Problem Relation Age of Onset   Cancer Father        Brain tumor   Other Father        Brain tumor   Heart  disease Father    Healthy Mother    Breast cancer Neg Hx     Review of Systems  All other systems reviewed and are negative.  Exam:   BP 138/80 (Cuff Size: Normal)   Pulse 60   Ht '5\' 6"'$  (1.676 m)   Wt 189 lb (85.7 kg)   SpO2 98%   BMI 30.51 kg/m   Weight change: '@WEIGHTCHANGE'$ @ Height:   Height: '5\' 6"'$  (167.6 cm)  Ht Readings from Last 3 Encounters:  07/12/21 '5\' 6"'$  (1.676 m)  11/26/20 '5\' 7"'$  (1.702 m)  08/16/20 '5\' 7"'$  (1.702 m)    General appearance: alert, cooperative and appears stated age Head: Normocephalic, without obvious abnormality, atraumatic Neck: no adenopathy, supple, symmetrical, trachea midline and thyroid normal to inspection and palpation Breasts: normal appearance, no masses or tenderness Abdomen: soft, non-tender; non distended,  no masses,  no organomegaly Extremities: extremities normal, atraumatic, no cyanosis or edema Skin: Skin color, texture, turgor normal. No rashes or lesions Lymph nodes: Cervical, supraclavicular, and  axillary nodes normal. No abnormal inguinal nodes palpated Neurologic: Grossly normal   Pelvic: External genitalia:  2 small skin tags on the right labia majora, several small condyloma in the posterior fourchette.               Urethra:  normal appearing urethra with no masses, tenderness or lesions              Bartholins and Skenes: normal                 Vagina: normal appearing vagina with normal color and discharge, no lesions              Cervix: absent               Bimanual Exam:  Uterus:  uterus absent              Adnexa: no mass, fullness, tenderness               Rectovaginal: Confirms               Anus:  normal sphincter tone, no lesions  Gae Dry chaperoned for the exam.  1. GYN exam for high-risk Medicare patient Discussed breast self exam Discussed calcium and vit D intake Labs with primary Colonoscopy and mammogram UTD  2. Screening for vaginal cancer - Cytology - PAP  3. History of vaginal dysplasia - Cytology - PAP  4. Condyloma - imiquimod (ALDARA) 5 % cream; Apply topically 3 (three) times a week. Apply entire packet of cream to affected areas 3 times weekly.  Apply at night and leave on during sleeping hours.  Wash off completely after 8 hours.  Dispense: 12 each; Refill: 2 -if they persist recommend that she return for removal  5. Screening examination for STD (sexually transmitted disease) Declines blood work - Cytology - PAP  6. Other specified personal risk factors, not elsewhere classified Needs yearly evaluation

## 2021-07-12 NOTE — Patient Instructions (Signed)
EXERCISE   We recommended that you start or continue a regular exercise program for good health. Physical activity is anything that gets your body moving, some is better than none. The CDC recommends 150 minutes per week of Moderate-Intensity Aerobic Activity and 2 or more days of Muscle Strengthening Activity.  Benefits of exercise are limitless: helps weight loss/weight maintenance, improves mood and energy, helps with depression and anxiety, improves sleep, tones and strengthens muscles, improves balance, improves bone density, protects from chronic conditions such as heart disease, high blood pressure and diabetes and so much more. To learn more visit: https://www.cdc.gov/physicalactivity/index.html  DIET: Good nutrition starts with a healthy diet of fruits, vegetables, whole grains, and lean protein sources. Drink plenty of water for hydration. Minimize empty calories, sodium, sweets. For more information about dietary recommendations visit: https://health.gov/our-work/nutrition-physical-activity/dietary-guidelines and https://www.myplate.gov/  ALCOHOL:  Women should limit their alcohol intake to no more than 7 drinks/beers/glasses of wine (combined, not each!) per week. Moderation of alcohol intake to this level decreases your risk of breast cancer and liver damage.  If you are concerned that you may have a problem, or your friends have told you they are concerned about your drinking, there are many resources to help. A well-known program that is free, effective, and available to all people all over the nation is Alcoholics Anonymous.  Check out this site to learn more: https://www.aa.org/   CALCIUM AND VITAMIN D:  Adequate intake of calcium and Vitamin D are recommended for bone health.  You should be getting between 1000-1200 mg of calcium and 800 units of Vitamin D daily between diet and supplements  PAP SMEARS:  Pap smears, to check for cervical cancer or precancers,  have traditionally been  done yearly, scientific advances have shown that most women can have pap smears less often.  However, every woman still should have a physical exam from her gynecologist every year. It will include a breast check, inspection of the vulva and vagina to check for abnormal growths or skin changes, a visual exam of the cervix, and then an exam to evaluate the size and shape of the uterus and ovaries. We will also provide age appropriate advice regarding health maintenance, like when you should have certain vaccines, screening for sexually transmitted diseases, bone density testing, colonoscopy, mammograms, etc.   MAMMOGRAMS:  All women over 40 years old should have a routine mammogram.   COLON CANCER SCREENING: Now recommend starting at age 45. At this time colonoscopy is not covered for routine screening until 50. There are take home tests that can be done between 45-49.   COLONOSCOPY:  Colonoscopy to screen for colon cancer is recommended for all women at age 50.  We know, you hate the idea of the prep.  We agree, BUT, having colon cancer and not knowing it is worse!!  Colon cancer so often starts as a polyp that can be seen and removed at colonscopy, which can quite literally save your life!  And if your first colonoscopy is normal and you have no family history of colon cancer, most women don't have to have it again for 10 years.  Once every ten years, you can do something that may end up saving your life, right?  We will be happy to help you get it scheduled when you are ready.  Be sure to check your insurance coverage so you understand how much it will cost.  It may be covered as a preventative service at no cost, but you should check   your particular policy.      Breast Self-Awareness Breast self-awareness means being familiar with how your breasts look and feel. It involves checking your breasts regularly and reporting any changes to your health care provider. Practicing breast self-awareness is  important. A change in your breasts can be a sign of a serious medical problem. Being familiar with how your breasts look and feel allows you to find any problems early, when treatment is more likely to be successful. All women should practice breast self-awareness, including women who have had breast implants. How to do a breast self-exam One way to learn what is normal for your breasts and whether your breasts are changing is to do a breast self-exam. To do a breast self-exam: Look for Changes  Remove all the clothing above your waist. Stand in front of a mirror in a room with good lighting. Put your hands on your hips. Push your hands firmly downward. Compare your breasts in the mirror. Look for differences between them (asymmetry), such as: Differences in shape. Differences in size. Puckers, dips, and bumps in one breast and not the other. Look at each breast for changes in your skin, such as: Redness. Scaly areas. Look for changes in your nipples, such as: Discharge. Bleeding. Dimpling. Redness. A change in position. Feel for Changes Carefully feel your breasts for lumps and changes. It is best to do this while lying on your back on the floor and again while sitting or standing in the shower or tub with soapy water on your skin. Feel each breast in the following way: Place the arm on the side of the breast you are examining above your head. Feel your breast with the other hand. Start in the nipple area and make  inch (2 cm) overlapping circles to feel your breast. Use the pads of your three middle fingers to do this. Apply light pressure, then medium pressure, then firm pressure. The light pressure will allow you to feel the tissue closest to the skin. The medium pressure will allow you to feel the tissue that is a little deeper. The firm pressure will allow you to feel the tissue close to the ribs. Continue the overlapping circles, moving downward over the breast until you feel your  ribs below your breast. Move one finger-width toward the center of the body. Continue to use the  inch (2 cm) overlapping circles to feel your breast as you move slowly up toward your collarbone. Continue the up and down exam using all three pressures until you reach your armpit.  Write Down What You Find  Write down what is normal for each breast and any changes that you find. Keep a written record with breast changes or normal findings for each breast. By writing this information down, you do not need to depend only on memory for size, tenderness, or location. Write down where you are in your menstrual cycle, if you are still menstruating. If you are having trouble noticing differences in your breasts, do not get discouraged. With time you will become more familiar with the variations in your breasts and more comfortable with the exam. How often should I examine my breasts? Examine your breasts every month. If you are breastfeeding, the best time to examine your breasts is after a feeding or after using a breast pump. If you menstruate, the best time to examine your breasts is 5-7 days after your period is over. During your period, your breasts are lumpier, and it may be more   difficult to notice changes. When should I see my health care provider? See your health care provider if you notice: A change in shape or size of your breasts or nipples. A change in the skin of your breast or nipples, such as a reddened or scaly area. Unusual discharge from your nipples. A lump or thick area that was not there before. Pain in your breasts. Anything that concerns you. Genital Warts  Genital warts are a common STI (sexually transmitted infection). They may appear as small bumps on the skin of the genital and anal areas. They sometimes become irritated and cause pain. Genital warts are easily passedto other people through sexual contact. Many people do not know that they are infected, and they may be  infected for years without symptoms. Even without symptoms, they can pass the infection totheir sexual partners. What are the causes? This condition is caused by a virus that is called human papillomavirus (HPV). HPV is spread by having unprotected sex with an infected person. It can bespread through vaginal, anal, and oral sex. What increases the risk? You are more likely to develop this condition if: You have unprotected sex. You have multiple sexual partners. You are sexually active before age 6. You are a man who is not circumcised. You have a female sexual partner who is not circumcised. You have a weakened body defense system (immune system) due to disease or medicine. What are the signs or symptoms? Symptoms of this condition include: Small growths in the genital area or anal area. These warts often grow in clusters. Itching and irritation in the genital area or anal area. Bleeding from the warts. Pain during sex. How is this diagnosed? This condition is diagnosed based on your symptoms and a physical exam. You may also have other tests, including: Biopsy. A tissue sample is removed so it can be checked under a microscope. Colposcopy. In females, a magnifying tool is used to examine the vagina and cervix. Certain solutions may be used to make the HPV cells change color so they can be seen more easily. A Pap test in females. Tests for other STIs. How is this treated? This condition may be treated with: Medicines, such as solutions or creams that are applied to your skin (topical). Procedures, such as: Freezing the warts with liquid nitrogen (cryotherapy). Burning the warts with a laser or electric probe (electrocautery). Surgery to remove the warts. Getting treatment is important because genital warts can lead to other problems. In females, the virus that causes genital warts may increase the riskfor cervical cancer. Follow these instructions at home: Medicines  Apply  over-the-counter and prescription medicines only as told by your health care provider. Do not treat genital warts with medicines that are used for treating hand warts. Talk with your health care provider about using over-the-counter anti-itch creams.  Instructions for women Get screened regularly for cervical cancer. This type of cancer is slow growing and can almost always be cured if it is found early. If you become pregnant, tell your health care provider that you have had an HPV infection. Your health care provider will monitor you closely during pregnancy. General instructions Do not touch or scratch the warts. Do not have sex until your treatment has been completed. Tell your current and past sexual partners about your condition because they may also need treatment. After treatment, use condoms during sex to prevent future infections. Keep all follow-up visits as told by your health care provider. This is important. How is  this prevented? Talk with your health care provider about getting the HPV vaccine. The vaccine: Can prevent some HPV infections and cancers. Is recommended for males and females who are 22-67 years old. Is not recommended for pregnant women. Will not work if you already have HPV. Contact a health care provider if you: Have redness, swelling, or pain in the area of the treated skin. Have a fever. Feel generally ill. Feel lumps in and around your genital or anal area. Have bleeding in your genital or anal area. Have pain during sex or bleeding after sex. Summary Genital warts are a common STI (sexually transmitted infection). It may appear as small bumps on the genital and anal areas. This condition is caused by a virus that is called human papillomavirus (HPV). HPV is spread by having unprotected sex with an infected person. It can be spread through vaginal, anal, and oral sex. Treatment is important because genital warts can lead to other problems. In females,  the virus that causes genital warts may increase the risk for cervical cancer. This condition may be treated with medicine that is applied to the skin or procedures to remove the warts. The HPV vaccine can prevent some HPV infections and cancers. It is recommended that the vaccine be given to males and females who are 55-39 years old. This information is not intended to replace advice given to you by your health care provider. Make sure you discuss any questions you have with your healthcare provider. Document Revised: 09/15/2019 Document Reviewed: 09/15/2019 Elsevier Patient Education  [employer].

## 2021-07-16 LAB — CYTOLOGY - PAP
Chlamydia: NEGATIVE
Comment: NEGATIVE
Comment: NEGATIVE
Comment: NEGATIVE
Comment: NORMAL
Diagnosis: UNDETERMINED — AB
High risk HPV: NEGATIVE
Neisseria Gonorrhea: NEGATIVE
Trichomonas: NEGATIVE

## 2021-10-09 ENCOUNTER — Other Ambulatory Visit: Payer: Self-pay | Admitting: Obstetrics and Gynecology

## 2021-10-09 DIAGNOSIS — A630 Anogenital (venereal) warts: Secondary | ICD-10-CM

## 2021-10-14 NOTE — Telephone Encounter (Signed)
Per 07/12/21 "if they persist recommend that she return for removal"

## 2022-04-17 ENCOUNTER — Other Ambulatory Visit: Payer: Self-pay | Admitting: Obstetrics and Gynecology

## 2022-04-17 ENCOUNTER — Other Ambulatory Visit: Payer: Self-pay

## 2022-04-17 DIAGNOSIS — A630 Anogenital (venereal) warts: Secondary | ICD-10-CM

## 2022-04-17 NOTE — Telephone Encounter (Signed)
Last AEX 07/12/21--scheduled 07/17/22.

## 2022-04-18 NOTE — Telephone Encounter (Signed)
Duplicate. Request sent to [facility] yesterday.

## 2022-04-18 NOTE — Telephone Encounter (Signed)
She hasn't been seen since 8/22, she should come in for evaluation prior to prescribing further Aldara.

## 2022-06-16 DIAGNOSIS — F331 Major depressive disorder, recurrent, moderate: Secondary | ICD-10-CM | POA: Insufficient documentation

## 2022-07-10 NOTE — Progress Notes (Signed)
56 y.o. G2P0002 Divorced   White or Caucasian Unavailable Not Hispanic or Latino female here for annual exam.  H/O hysterectomy h/o LSIL in 8/21, koilocytic atypia. Last year pap with ASCUS, negative HPV.   H/O genital warts, last year treated with Aldara which didn't help.    Husband left 5 years ago for her daughters friend. They were married x 36 years.   She is sexually active, same partner x 2 years, don't live together. No dyspareunia.   Has bad hot flashes and night sweats. Doesn't sleep well. She has issues with insomnia.     No LMP recorded. Patient has had a hysterectomy.          Sexually active: Yes.    The current method of family planning is status post hysterectomy.    Exercising: No.  The patient does not participate in regular exercise at present. Smoker:  yes vape   Health Maintenance: Pap:  07/12/21 ASCUS hr hpv neg, 06/19/20 LSIL, +HPV; 03/28/15 ASCUS  History of abnormal Pap:  yes 07/11/20 Vaginal bx mild atypia not dysplasia or precancer tissue. MMG:  01/28/21 density C Bi-rads 1 neg  BMD:   never  Colonoscopy: 02/11/16 polyps f/u 5 years  TDaP:  07/05/15 Gardasil: n/a   reports that she has quit smoking. Her smoking use included cigarettes. She smoked an average of 1 pack per day. She has never used smokeless tobacco. She reports current alcohol use. She reports that she does not use drugs. Couple of drinks a week. She is on disability for issues with her ankle, trouble walking and standing.  2 daughters, estranged. 6 grand kids.   Past Medical History:  Diagnosis Date   Anxiety    Arthritis    ASCUS favor benign 09/2014, 03/2015   negative high risk HPV 09/2014   Broken ankle    Depression    Enlarged thyroid    GERD (gastroesophageal reflux disease)    Heart murmur    HTN (hypertension)     Past Surgical History:  Procedure Laterality Date   ABDOMINAL HYSTERECTOMY  1993   endometriosis with pain   ABDOMINAL HYSTERECTOMY     ANKLE FRACTURE SURGERY  Right    ANKLE SURGERY     CESAREAN SECTION     Neuro stimulator implant     orif lt wrist     TUBAL LIGATION  1997   TUBAL LIGATION      Current Outpatient Medications  Medication Sig Dispense Refill   ALPRAZolam (XANAX) 0.5 MG tablet Take 0.5 mg by mouth at bedtime as needed for anxiety.     buPROPion (WELLBUTRIN XL) 300 MG 24 hr tablet Take 300 mg by mouth every morning.     FLUoxetine (PROZAC) 40 MG capsule Take 40 mg by mouth daily.     furosemide (LASIX) 40 MG tablet Take 40 mg by mouth daily. Reported on 11/12/2015     ibuprofen (ADVIL,MOTRIN) 600 MG tablet Take 1 tablet (600 mg total) by mouth every 6 (six) hours as needed. 60 tablet 5   Multiple Vitamin (MULTIVITAMIN) tablet Take 1 tablet by mouth daily.     omeprazole (PRILOSEC) 20 MG capsule Take 20 mg by mouth daily.     zolpidem (AMBIEN) 10 MG tablet Take 10 mg by mouth at bedtime as needed for sleep.     potassium chloride (MICRO-K) 10 MEQ CR capsule Take 10 mEq by mouth daily. (Patient not taking: Reported on 08/16/2020)     valsartan (DIOVAN) 160 MG  tablet Take 160 mg by mouth daily.     No current facility-administered medications for this visit.    Family History  Problem Relation Age of Onset   Cancer Father        Brain tumor   Other Father        Brain tumor   Heart disease Father    Healthy Mother    Breast cancer Neg Hx     Review of Systems  All other systems reviewed and are negative.   Exam:   BP 123/72   Pulse 66   Wt 213 lb (96.6 kg)   SpO2 99%   BMI 34.38 kg/m   Weight change: '@WEIGHTCHANGE'$ @ Height:      Ht Readings from Last 3 Encounters:  07/12/21 '5\' 6"'$  (1.676 m)  11/26/20 '5\' 7"'$  (1.702 m)  08/16/20 '5\' 7"'$  (1.702 m)    General appearance: alert, cooperative and appears stated age Head: Normocephalic, without obvious abnormality, atraumatic Neck: no adenopathy, supple, symmetrical, trachea midline and thyroid normal to inspection and palpation Breasts: normal appearance, no masses or  tenderness Abdomen: soft, non-tender; non distended,  no masses,  no organomegaly Extremities: extremities normal, atraumatic, no cyanosis or edema Skin: Skin color, texture, turgor normal. No rashes or lesions Lymph nodes: Cervical, supraclavicular, and axillary nodes normal. No abnormal inguinal nodes palpated Neurologic: Grossly normal   Pelvic: External genitalia: on the right labia majora are 2 lesions, ? Condyloma vs skin tags. On the perineum is a raised white lesion c/w condyloma, 2 other areas of whitening.               Urethra:  normal appearing urethra with no masses, tenderness or lesions              Bartholins and Skenes: normal                 Vagina: normal appearing vagina with normal color and discharge, no lesions              Cervix: absent               Bimanual Exam:  Uterus:  uterus absent              Adnexa: no mass, fullness, tenderness               Rectovaginal: Confirms               Anus:  normal sphincter tone, no lesions   1. GYN exam for high-risk Medicare patient Discussed breast self exam Discussed calcium and vit D intake  2. Screening for vaginal cancer - Cytology - PAP  3. History of vaginal dysplasia  4. Condyloma Not resolved with aldara, now with pigment change. Return for biopsy. Will also put acetic-acid on the upper vulvar lesions to try and determine if they are condyloma.  - Biopsy vulva; Future  5. Screening examination for STD (sexually transmitted disease) - RPR - HIV Antibody (routine testing w rflx) - Hepatitis C antibody - HSV(herpes simplex vrs) 1+2 ab-IgG - Cytology - PAP  6. Vasomotor symptoms due to menopause Discussed behavioral changes and over the counter options for treatment.  She is taking ambien and xanax to sleep, I wouldn't want to add gabapentin

## 2022-07-17 ENCOUNTER — Ambulatory Visit (INDEPENDENT_AMBULATORY_CARE_PROVIDER_SITE_OTHER): Payer: Medicare HMO | Admitting: Obstetrics and Gynecology

## 2022-07-17 ENCOUNTER — Encounter: Payer: Self-pay | Admitting: Obstetrics and Gynecology

## 2022-07-17 ENCOUNTER — Other Ambulatory Visit (HOSPITAL_COMMUNITY)
Admission: RE | Admit: 2022-07-17 | Discharge: 2022-07-17 | Disposition: A | Discharge status: Home / Self Care | Payer: Medicare HMO | Source: Ambulatory Visit | Attending: Obstetrics and Gynecology | Admitting: Obstetrics and Gynecology

## 2022-07-17 VITALS — BP 123/72 | HR 66 | Wt 213.0 lb

## 2022-07-17 DIAGNOSIS — A630 Anogenital (venereal) warts: Secondary | ICD-10-CM | POA: Diagnosis not present

## 2022-07-17 DIAGNOSIS — Z113 Encounter for screening for infections with a predominantly sexual mode of transmission: Secondary | ICD-10-CM | POA: Insufficient documentation

## 2022-07-17 DIAGNOSIS — N951 Menopausal and female climacteric states: Secondary | ICD-10-CM

## 2022-07-17 DIAGNOSIS — Z1151 Encounter for screening for human papillomavirus (HPV): Secondary | ICD-10-CM | POA: Insufficient documentation

## 2022-07-17 DIAGNOSIS — Z1272 Encounter for screening for malignant neoplasm of vagina: Secondary | ICD-10-CM

## 2022-07-17 DIAGNOSIS — Z87411 Personal history of vaginal dysplasia: Secondary | ICD-10-CM

## 2022-07-17 DIAGNOSIS — Z01419 Encounter for gynecological examination (general) (routine) without abnormal findings: Secondary | ICD-10-CM | POA: Diagnosis present

## 2022-07-17 DIAGNOSIS — Z9189 Other specified personal risk factors, not elsewhere classified: Secondary | ICD-10-CM

## 2022-07-17 NOTE — Patient Instructions (Addendum)
Try estroven for hot flashes  EXERCISE   We recommended that you start or continue a regular exercise program for good health. Physical activity is anything that gets your body moving, some is better than none. The CDC recommends 150 minutes per week of Moderate-Intensity Aerobic Activity and 2 or more days of Muscle Strengthening Activity.  Benefits of exercise are limitless: helps weight loss/weight maintenance, improves mood and energy, helps with depression and anxiety, improves sleep, tones and strengthens muscles, improves balance, improves bone density, protects from chronic conditions such as heart disease, high blood pressure and diabetes and so much more. To learn more visit: WhyNotPoker.uy  DIET: Good nutrition starts with a healthy diet of fruits, vegetables, whole grains, and lean protein sources. Drink plenty of water for hydration. Minimize empty calories, sodium, sweets. For more information about dietary recommendations visit: GeekRegister.com.ee and http://schaefer-mitchell.com/  ALCOHOL:  Women should limit their alcohol intake to no more than 7 drinks/beers/glasses of wine (combined, not each!) per week. Moderation of alcohol intake to this level decreases your risk of breast cancer and liver damage.  If you are concerned that you may have a problem, or your friends have told you they are concerned about your drinking, there are many resources to help. A well-known program that is free, effective, and available to all people all over the nation is Alcoholics Anonymous.  Check out this site to learn more: BlockTaxes.se   CALCIUM AND VITAMIN D:  Adequate intake of calcium and Vitamin D are recommended for bone health.  You should be getting between 1000-1200 mg of calcium and 800 units of Vitamin D daily between diet and supplements  PAP SMEARS:  Pap smears, to check for cervical cancer or  precancers,  have traditionally been done yearly, scientific advances have shown that most women can have pap smears less often.  However, every woman still should have a physical exam from her gynecologist every year. It will include a breast check, inspection of the vulva and vagina to check for abnormal growths or skin changes, a visual exam of the cervix, and then an exam to evaluate the size and shape of the uterus and ovaries. We will also provide age appropriate advice regarding health maintenance, like when you should have certain vaccines, screening for sexually transmitted diseases, bone density testing, colonoscopy, mammograms, etc.   MAMMOGRAMS:  All women over 52 years old should have a routine mammogram.   COLON CANCER SCREENING: Now recommend starting at age 57. At this time colonoscopy is not covered for routine screening until 50. There are take home tests that can be done between 45-49.   COLONOSCOPY:  Colonoscopy to screen for colon cancer is recommended for all women at age 17.  We know, you hate the idea of the prep.  We agree, BUT, having colon cancer and not knowing it is worse!!  Colon cancer so often starts as a polyp that can be seen and removed at colonscopy, which can quite literally save your life!  And if your first colonoscopy is normal and you have no family history of colon cancer, most women don't have to have it again for 10 years.  Once every ten years, you can do something that may end up saving your life, right?  We will be happy to help you get it scheduled when you are ready.  Be sure to check your insurance coverage so you understand how much it will cost.  It may be covered as a preventative service at  no cost, but you should check your particular policy.      Breast Self-Awareness Breast self-awareness means being familiar with how your breasts look and feel. It involves checking your breasts regularly and reporting any changes to your health care  provider. Practicing breast self-awareness is important. A change in your breasts can be a sign of a serious medical problem. Being familiar with how your breasts look and feel allows you to find any problems early, when treatment is more likely to be successful. All women should practice breast self-awareness, including women who have had breast implants. How to do a breast self-exam One way to learn what is normal for your breasts and whether your breasts are changing is to do a breast self-exam. To do a breast self-exam: Look for Changes  Remove all the clothing above your waist. Stand in front of a mirror in a room with good lighting. Put your hands on your hips. Push your hands firmly downward. Compare your breasts in the mirror. Look for differences between them (asymmetry), such as: Differences in shape. Differences in size. Puckers, dips, and bumps in one breast and not the other. Look at each breast for changes in your skin, such as: Redness. Scaly areas. Look for changes in your nipples, such as: Discharge. Bleeding. Dimpling. Redness. A change in position. Feel for Changes Carefully feel your breasts for lumps and changes. It is best to do this while lying on your back on the floor and again while sitting or standing in the shower or tub with soapy water on your skin. Feel each breast in the following way: Place the arm on the side of the breast you are examining above your head. Feel your breast with the other hand. Start in the nipple area and make  inch (2 cm) overlapping circles to feel your breast. Use the pads of your three middle fingers to do this. Apply light pressure, then medium pressure, then firm pressure. The light pressure will allow you to feel the tissue closest to the skin. The medium pressure will allow you to feel the tissue that is a little deeper. The firm pressure will allow you to feel the tissue close to the ribs. Continue the overlapping circles,  moving downward over the breast until you feel your ribs below your breast. Move one finger-width toward the center of the body. Continue to use the  inch (2 cm) overlapping circles to feel your breast as you move slowly up toward your collarbone. Continue the up and down exam using all three pressures until you reach your armpit.  Write Down What You Find  Write down what is normal for each breast and any changes that you find. Keep a written record with breast changes or normal findings for each breast. By writing this information down, you do not need to depend only on memory for size, tenderness, or location. Write down where you are in your menstrual cycle, if you are still menstruating. If you are having trouble noticing differences in your breasts, do not get discouraged. With time you will become more familiar with the variations in your breasts and more comfortable with the exam. How often should I examine my breasts? Examine your breasts every month. If you are breastfeeding, the best time to examine your breasts is after a feeding or after using a breast pump. If you menstruate, the best time to examine your breasts is 5-7 days after your period is over. During your period, your breasts are   lumpier, and it may be more difficult to notice changes. When should I see my health care provider? See your health care provider if you notice: A change in shape or size of your breasts or nipples. A change in the skin of your breast or nipples, such as a reddened or scaly area. Unusual discharge from your nipples. A lump or thick area that was not there before. Pain in your breasts. Anything that concerns you.

## 2022-07-18 LAB — HSV(HERPES SIMPLEX VRS) I + II AB-IGG
HAV 1 IGG,TYPE SPECIFIC AB: <0.9 index
HSV 2 IGG,TYPE SPECIFIC AB: <0.9 index

## 2022-07-18 LAB — RPR: RPR Ser Ql: NONREACTIVE

## 2022-07-18 LAB — HIV ANTIBODY (ROUTINE TESTING W REFLEX): HIV 1&2 Ab, 4th Generation: NONREACTIVE

## 2022-07-18 LAB — HEPATITIS C ANTIBODY: Hepatitis C Ab: NONREACTIVE

## 2022-07-25 LAB — CYTOLOGY - PAP
Chlamydia: NEGATIVE
Comment: NEGATIVE
Comment: NEGATIVE
Comment: NEGATIVE
Comment: NORMAL
High risk HPV: NEGATIVE
Neisseria Gonorrhea: NEGATIVE
Trichomonas: NEGATIVE

## 2022-07-29 ENCOUNTER — Other Ambulatory Visit: Payer: Self-pay

## 2022-07-29 DIAGNOSIS — R87612 Low grade squamous intraepithelial lesion on cytologic smear of cervix (LGSIL): Secondary | ICD-10-CM

## 2022-07-30 NOTE — Telephone Encounter (Signed)
Called patient and spoke with her about results she is scheduled on 08/06/22

## 2022-08-06 ENCOUNTER — Ambulatory Visit: Payer: Medicare HMO | Admitting: Obstetrics and Gynecology

## 2022-08-12 ENCOUNTER — Ambulatory Visit (INDEPENDENT_AMBULATORY_CARE_PROVIDER_SITE_OTHER): Payer: Medicare HMO | Admitting: Obstetrics and Gynecology

## 2022-08-12 ENCOUNTER — Other Ambulatory Visit (HOSPITAL_COMMUNITY)
Admission: RE | Admit: 2022-08-12 | Discharge: 2022-08-12 | Disposition: A | Discharge status: Home / Self Care | Payer: Medicare HMO | Source: Ambulatory Visit | Attending: Obstetrics and Gynecology | Admitting: Obstetrics and Gynecology

## 2022-08-12 ENCOUNTER — Encounter: Payer: Self-pay | Admitting: Obstetrics and Gynecology

## 2022-08-12 VITALS — BP 128/76 | HR 72 | Wt 211.0 lb

## 2022-08-12 DIAGNOSIS — R87622 Low grade squamous intraepithelial lesion on cytologic smear of vagina (LGSIL): Secondary | ICD-10-CM | POA: Insufficient documentation

## 2022-08-12 DIAGNOSIS — A630 Anogenital (venereal) warts: Secondary | ICD-10-CM

## 2022-08-12 NOTE — Progress Notes (Signed)
GYNECOLOGY  VISIT   HPI: 56 y.o.   Divorced   White or Caucasian Unavailable Not Hispanic or Latino  female   [phone] with No LMP recorded. Patient has had a hysterectomy.   here for here for  vaginal colpo and vulvar bx.   H/O hysterectomy. Pap from 07/17/22 with LSIL, neg HPV testing.   GYNECOLOGIC HISTORY: No LMP recorded. Patient has had a hysterectomy. Contraception:hysterectomy  Menopausal hormone therapy: none         OB History     Gravida  2   Para  2   Term  0   Preterm  0   AB  0   Living  2      SAB  0   IAB  0   Ectopic  0   Multiple      Live Births                 Patient Active Problem List   Diagnosis Date Noted   Moderate episode of recurrent major depressive disorder ([place]) 06/16/2022   Pure hypercholesterolemia 06/12/2021   Genital warts 07/09/2020   Prediabetes 06/14/2020   Bilateral carpal tunnel syndrome 07/22/2019   Bipolar disorder without psychotic features ([place]) 01/01/2016   Depression 01/01/2016   Daytime somnolence 01/01/2016   Dependent edema 01/01/2016   Deviated septum 01/01/2016   Generalized anxiety disorder 01/01/2016   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   Heartburn 01/01/2016   Hypertension 01/01/2016   Obesity 01/01/2016   Osteoarthritis 01/01/2016   Peripheral neuropathy 01/01/2016   Snoring 01/01/2016   Sleep disturbance 07/18/2015   Arthritis of ankle 09/07/2014   Ankle fracture, right 07/29/2014   Chronic pain syndrome 08/01/2013    Past Medical History:  Diagnosis Date   Anxiety    Arthritis    ASCUS favor benign 09/2014, 03/2015   negative high risk HPV 09/2014   Broken ankle    Depression    Enlarged thyroid    GERD (gastroesophageal reflux disease)    Heart murmur    HTN (hypertension)     Past Surgical History:  Procedure Laterality Date   ABDOMINAL HYSTERECTOMY  1993   endometriosis with pain   ABDOMINAL HYSTERECTOMY     ANKLE FRACTURE SURGERY Right    ANKLE SURGERY     CESAREAN  SECTION     Neuro stimulator implant     orif lt wrist     TUBAL LIGATION  1997   TUBAL LIGATION      Current Outpatient Medications  Medication Sig Dispense Refill   ALPRAZolam (XANAX) 0.5 MG tablet Take 0.5 mg by mouth at bedtime as needed for anxiety.     DULoxetine (CYMBALTA) 30 MG capsule Take by mouth.     FLUoxetine (PROZAC) 40 MG capsule Take 40 mg by mouth daily.     furosemide (LASIX) 40 MG tablet Take 40 mg by mouth daily. Reported on 11/12/2015     ibuprofen (ADVIL,MOTRIN) 600 MG tablet Take 1 tablet (600 mg total) by mouth every 6 (six) hours as needed. 60 tablet 5   Multiple Vitamin (MULTIVITAMIN) tablet Take 1 tablet by mouth daily.     omeprazole (PRILOSEC) 20 MG capsule Take 20 mg by mouth daily.     potassium chloride (MICRO-K) 10 MEQ CR capsule Take 10 mEq by mouth daily.     valsartan (DIOVAN) 160 MG tablet Take 160 mg by mouth daily.     zolpidem (AMBIEN) 10 MG tablet Take 10 mg by mouth at  bedtime as needed for sleep.     No current facility-administered medications for this visit.     ALLERGIES: Patient has no known allergies.  Family History  Problem Relation Age of Onset   Cancer Father        Brain tumor   Other Father        Brain tumor   Heart disease Father    Healthy Mother    Breast cancer Neg Hx     Social History   Socioeconomic History   Marital status: Divorced    Spouse name: Not on file   Number of children: 2   Years of education: Not on file   Highest education level: Not on file  Occupational History   Occupation: unemployed  Tobacco Use   Smoking status: Former    Packs/day: 1.00    Types: Cigarettes   Smokeless tobacco: Never   Tobacco comments:    uses e-sig 01/08/16  Vaping Use   Vaping Use: Every day  Substance and Sexual Activity   Alcohol use: Yes    Comment: Occas   Drug use: No   Sexual activity: Not Currently    Birth control/protection: Surgical    Comment: HYST  Other Topics Concern   Not on file   Social History Narrative   ** Merged History Encounter **       Social Determinants of Health   Financial Resource Strain: Not on file  Food Insecurity: Not on file  Transportation Needs: Not on file  Physical Activity: Not on file  Stress: Not on file  Social Connections: Not on file  Intimate Partner Violence: Not on file    Review of Systems  All other systems reviewed and are negative.   PHYSICAL EXAMINATION:    BP 128/76   Pulse 72   Wt 211 lb (95.7 kg)   SpO2 100%   BMI 34.06 kg/m     General appearance: alert, cooperative and appears stated age  Pelvic: External genitalia:  no lesions              Urethra:  normal appearing urethra with no masses, tenderness or lesions              Bartholins and Skenes: normal                 Vagina: normal appearing vagina with normal color and discharge, no lesions              Cervix: absent  The risks of the procedure were reviewed with the patient and a consent was signed.              Graves speculum used. Colposcopy of vagina without aceto-white changes,  decrease lugols uptake focally at the vaginal cuff, several attempts at biopsy with minimal tissues. Speculum blades released, used a grasper, still minimal tissue obtained. No other abnormalities seen.  Colposcopy of the vulva, on the perineum are several warty lesions, ~3 together and another one more distant.  The area was injected with 1% lidocaine. A #11 blade was used to remove the group of condyloma together in an eliptical fashion. The 3 mm punch biopsy was used to remove the remaining warty lesion. The defects were closed with interrupted stiches of 4-0 vicryl. The patient tolerated the procedure well.    Chaperone was present for exam.  1. Condyloma - Biopsy vulva - Surgical pathology( [place]/ [facility])  2. LGSIL Pap smear of vagina - Colposcopy - Surgical  pathology( [place]/ [facility])

## 2022-08-12 NOTE — Patient Instructions (Addendum)
Vulvar Biopsy Post-procedure Instructions You may take Ibuprofen, Aleve, or Tylenol for any pain or discomfort. You may have a small amount of spotting.  You should wear a mini pad for the next few days. You may use some topical Neosporin ointment (over the counter) if you would like.   You will need to call the office if you have redness around the biopsy site, if there is any unusual drainage, if the bleeding is heavy, or if you have any concerns.   Shower or bathe as normal You will be notified within one week of your biopsy results or we will discuss your results at your follow-up appointment if needed.    Colposcopy Post-procedure Instructions Cramping is common.  You may take Ibuprofen, Aleve, or Tylenol for the cramping.  This should resolve within the next two to three days.   You may have bright red spotting or blackish discharge for several days after your procedure.  The discharge occurs because of a topical solution used to stop bleeding at the biopsy site(s).  You should wear a mini pad for the next few days. Refrain from putting anything in the vagina until the bleeding and/or discharge stops (usually less than a week). You need to call the office if you have any pelvic pain, fever, heavy bleeding, or foul smelling vaginal discharge. Shower or bathe as normal You will be notified within one week of your biopsy results or we will discuss your results at your follow-up appointment if needed.

## 2022-08-15 LAB — SURGICAL PATHOLOGY

## 2022-12-09 ENCOUNTER — Other Ambulatory Visit: Payer: Self-pay | Admitting: Physician Assistant

## 2022-12-09 DIAGNOSIS — Z1231 Encounter for screening mammogram for malignant neoplasm of breast: Secondary | ICD-10-CM

## 2023-01-07 ENCOUNTER — Encounter: Payer: Self-pay | Admitting: Gastroenterology

## 2023-01-15 ENCOUNTER — Encounter: Payer: Self-pay | Admitting: Radiology

## 2023-03-17 ENCOUNTER — Ambulatory Visit
Admission: EM | Admit: 2023-03-17 | Discharge: 2023-03-17 | Disposition: A | Discharge status: Home / Self Care | Payer: Medicare HMO | Attending: Nurse Practitioner | Admitting: Nurse Practitioner

## 2023-03-17 DIAGNOSIS — S46811A Strain of other muscles, fascia and tendons at shoulder and upper arm level, right arm, initial encounter: Secondary | ICD-10-CM | POA: Diagnosis not present

## 2023-03-17 MED ORDER — METHYLPREDNISOLONE SODIUM SUCC 125 MG IJ SOLR
60.0000 mg | Freq: Once | INTRAMUSCULAR | Status: AC
  Administered 2023-03-17: 60 mg via INTRAMUSCULAR

## 2023-03-17 MED ORDER — TIZANIDINE HCL 4 MG PO TABS: 4.0000 mg | ORAL_TABLET | Freq: Every evening | ORAL | 0 refills | Status: DC | PRN

## 2023-03-17 MED ORDER — METHYLPREDNISOLONE 4 MG PO TBPK: ORAL_TABLET | ORAL | 0 refills | Status: DC

## 2023-03-17 NOTE — ED Triage Notes (Signed)
Pt presents with ongoing right shoulder pain that radiates down arm around to back with a tingling sensation for over a week with no known injury.

## 2023-03-17 NOTE — Discharge Instructions (Addendum)
We have given you a steroid shot to help with pain today.  Start the Medrol Dosepak tomorrow morning to help with pain/inflammation.  You can also take the tizanidine at nighttime as needed for muscular pain.  Continue cool compresses, light range of motion/stretching exercises.  Follow-up with orthopedic provider with no improvement or worsening of symptoms despite treatment.

## 2023-03-17 NOTE — ED Provider Notes (Signed)
[facility]    CSN: [id number] Arrival date & time: 03/17/23  1106      History   Chief Complaint Chief Complaint  Patient presents with   Shoulder Pain    HPI [name] is a 57 y.o. female.   Patient presents today with 1 week history of right upper back/lower neck pain.  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  No recent injury, fall, trauma, or accident involving the right neck, upper arm, or shoulder.  Reports she thought she may have pulled a muscle or pinched nerve, so she rested and took anti-inflammatory medication and Tylenol which did seem to help temporarily.  Reports she works typically Thursdays and Fridays, symptoms improve when she is not working, then worsen while at work.  She has taken husbands tizanidine with no benefit.  Reports now, she is having pain moving down into her right upper back as well as down the right upper arm.  No numbness/tingling in the fingertips, decreased grip strength, decrease sensation, or color changes of the right upper extremity.    Past Medical History:  Diagnosis Date   Anxiety    Arthritis    ASCUS favor benign 09/2014, 03/2015   negative high risk HPV 09/2014   Broken ankle    Depression    Enlarged thyroid    GERD (gastroesophageal reflux disease)    Heart murmur    HTN (hypertension)     Patient Active Problem List   Diagnosis Date Noted   Moderate episode of recurrent major depressive disorder (HCC) 06/16/2022   Pure hypercholesterolemia 06/12/2021   Genital warts 07/09/2020   Prediabetes 06/14/2020   Bilateral carpal tunnel syndrome 07/22/2019   Bipolar disorder without psychotic features (HCC) 01/01/2016   Depression 01/01/2016   Daytime somnolence 01/01/2016   Dependent edema 01/01/2016   Deviated septum 01/01/2016   Generalized anxiety disorder 01/01/2016   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   Heartburn 01/01/2016   Hypertension 01/01/2016   Obesity  01/01/2016   Osteoarthritis 01/01/2016   Peripheral neuropathy 01/01/2016   Snoring 01/01/2016   Sleep disturbance 07/18/2015   Arthritis of ankle 09/07/2014   Ankle fracture, right 07/29/2014   Chronic pain syndrome 08/01/2013    Past Surgical History:  Procedure Laterality Date   ABDOMINAL HYSTERECTOMY  1993   endometriosis with pain   ABDOMINAL HYSTERECTOMY     ANKLE FRACTURE SURGERY Right    ANKLE SURGERY     CESAREAN SECTION     Neuro stimulator implant     orif lt wrist     TUBAL LIGATION  1997   TUBAL LIGATION      OB History     Gravida  2   Para  2   Term  0   Preterm  0   AB  0   Living  2      SAB  0   IAB  0   Ectopic  0   Multiple      Live Births               Home Medications    Prior to Admission medications   Medication Sig Start Date End Date Taking? Authorizing Provider  methylPREDNISolone (MEDROL DOSEPAK) 4 MG TBPK tablet Use as directed on package. 03/17/23  Yes Valentino Nose, NP  tiZANidine (ZANAFLEX) 4 MG tablet Take 1 tablet (4 mg total) by mouth at bedtime as needed for muscle spasms. Do not take  with alcohol or while driving or operating heavy machinery.  May cause drowsiness. 03/17/23  Yes Valentino Nose, NP  ALPRAZolam Prudy Feeler) 0.5 MG tablet Take 0.5 mg by mouth at bedtime as needed for anxiety.    [provider]  DULoxetine (CYMBALTA) 30 MG capsule Take by mouth. 07/23/22   [provider]  FLUoxetine (PROZAC) 40 MG capsule Take 40 mg by mouth daily.    [provider]  furosemide (LASIX) 40 MG tablet Take 40 mg by mouth daily. Reported on 11/12/2015 03/20/15   [provider]  ibuprofen (ADVIL,MOTRIN) 600 MG tablet Take 1 tablet (600 mg total) by mouth every 6 (six) hours as needed. 07/27/14   Vickki Hearing, MD  Multiple Vitamin (MULTIVITAMIN) tablet Take 1 tablet by mouth daily.    [provider]  omeprazole (PRILOSEC) 20 MG capsule Take 20 mg by mouth daily.     [provider]  potassium chloride (MICRO-K) 10 MEQ CR capsule Take 10 mEq by mouth daily. 03/20/15   [provider]  valsartan (DIOVAN) 160 MG tablet Take 160 mg by mouth daily. 06/29/22   [provider]  zolpidem (AMBIEN) 10 MG tablet Take 10 mg by mouth at bedtime as needed for sleep.    [provider]    Family History Family History  Problem Relation Age of Onset   Cancer Father        Brain tumor   Other Father        Brain tumor   Heart disease Father    Healthy Mother    Breast cancer Neg Hx     Social History Social History   Tobacco Use   Smoking status: Former    Packs/day: 1    Types: Cigarettes   Smokeless tobacco: Never   Tobacco comments:    uses e-sig 01/08/16  Vaping Use   Vaping Use: Every day  Substance Use Topics   Alcohol use: Yes    Comment: Occas   Drug use: No     Allergies   Patient has no known allergies.   Review of Systems Review of Systems Per HPI  Physical Exam Triage Vital Signs ED Triage Vitals  Enc Vitals Group     BP 03/17/23 1123 134/83     Pulse Rate 03/17/23 1123 82     Resp 03/17/23 1123 18     Temp 03/17/23 1123 98.3 F (36.8 C)     Temp Source 03/17/23 1123 Oral     SpO2 03/17/23 1123 96 %     Weight --      Height --      Head Circumference --      Peak Flow --      Pain Score 03/17/23 1126 5     Pain Loc --      Pain Edu? --      Excl. in GC? --    No data found.  Updated Vital Signs BP 134/83 (BP Location: Right Arm)   Pulse 82   Temp 98.3 F (36.8 C) (Oral)   Resp 18   SpO2 96%   Visual Acuity Right Eye Distance:   Left Eye Distance:   Bilateral Distance:    Right Eye Near:   Left Eye Near:    Bilateral Near:     Physical Exam Vitals and nursing note reviewed.  Constitutional:      General: She is not in acute distress.    Appearance: Normal appearance. She is  not toxic-appearing.  HENT:     Mouth/Throat:     Mouth: Mucous membranes are moist.      Pharynx: Oropharynx is clear.  Pulmonary:     Effort: Pulmonary effort is normal. No respiratory distress.  Musculoskeletal:       Arms:     Comments: Inspection: no swelling, obvious deformity, redness, or bruising to the right neck, shoulder, back, or upper arm Palpation: right trapezius tender to palpation in approximately area marked; no obvious deformities palpates ROM: Full ROM to right shoulder, upper arm.  Abduction of shoulder is painful. Strength: 5/5 bilateral upper extremities Neurovascular: neurovascularly intact in left and right upper extremity  Skin:    General: Skin is warm and dry.     Capillary Refill: Capillary refill takes less than 2 seconds.     Coloration: Skin is not jaundiced or pale.     Findings: No erythema.  Neurological:     Mental Status: She is alert and oriented to person, place, and time.  Psychiatric:        Behavior: Behavior is cooperative.      UC Treatments / Results  Labs (all labs ordered are listed, but only abnormal results are displayed) Labs Reviewed - No data to display  EKG   Radiology No results found.  Procedures Procedures (including critical care time)  Medications Ordered in UC Medications  methylPREDNISolone sodium succinate (SOLU-MEDROL) 125 mg/2 mL injection 60 mg (60 mg Intramuscular Given 03/17/23 1148)    Initial Impression / Assessment and Plan / UC Course  I have reviewed the triage vital signs and the nursing notes.  Pertinent labs & imaging results that were available during my care of the patient were reviewed by me and considered in my medical decision making (see chart for details).   Patient is well-appearing, normotensive, afebrile, not tachycardic, not tachypneic, oxygenating well on room air.    1. Strain of right trapezius muscle, initial encounter Treat with Solu-Medrol 60 mg IM today in urgent care given radicular pain, start Medrol Dosepak tomorrow morning Continue supportive care including  ice, light range of motion/stretching exercises, muscle accident nighttime as needed Follow-up with Ortho with persistent or worsening symptoms despite treatment  The patient was given the opportunity to ask questions.  All questions answered to their satisfaction.  The patient is in agreement to this plan.    Final Clinical Impressions(s) / UC Diagnoses   Final diagnoses:  Strain of right trapezius muscle, initial encounter     Discharge Instructions      We have given you a steroid shot to help with pain today.  Start the Medrol Dosepak tomorrow morning to help with pain/inflammation.  You can also take the tizanidine at nighttime as needed for muscular pain.  Continue cool compresses, light range of motion/stretching exercises.  Follow-up with orthopedic provider with no improvement or worsening of symptoms despite treatment.     ED Prescriptions     Medication Sig Dispense Auth. Provider   methylPREDNISolone (MEDROL DOSEPAK) 4 MG TBPK tablet Use as directed on package. 21 tablet Cathlean Marseilles A, NP   tiZANidine (ZANAFLEX) 4 MG tablet Take 1 tablet (4 mg total) by mouth at bedtime as needed for muscle spasms. Do not take with alcohol or while driving or operating heavy machinery.  May cause drowsiness. 30 tablet Valentino Nose, NP      PDMP not reviewed this encounter.   Valentino Nose, NP 03/17/23 [phone]

## 2023-03-24 ENCOUNTER — Ambulatory Visit
Admission: EM | Admit: 2023-03-24 | Discharge: 2023-03-24 | Disposition: A | Discharge status: Home / Self Care | Payer: Medicare HMO | Attending: Nurse Practitioner | Admitting: Nurse Practitioner

## 2023-03-24 ENCOUNTER — Ambulatory Visit (INDEPENDENT_AMBULATORY_CARE_PROVIDER_SITE_OTHER): Payer: Medicare HMO

## 2023-03-24 DIAGNOSIS — S46811D Strain of other muscles, fascia and tendons at shoulder and upper arm level, right arm, subsequent encounter: Secondary | ICD-10-CM | POA: Diagnosis not present

## 2023-03-24 MED ORDER — KETOROLAC TROMETHAMINE 30 MG/ML IJ SOLN
30.0000 mg | Freq: Once | INTRAMUSCULAR | Status: AC
  Administered 2023-03-24: 30 mg via INTRAMUSCULAR

## 2023-03-24 MED ORDER — IBUPROFEN 800 MG PO TABS: 800.0000 mg | ORAL_TABLET | Freq: Three times a day (TID) | ORAL | 0 refills | Status: DC | PRN

## 2023-03-24 NOTE — ED Triage Notes (Signed)
Pt follow up on appointment from 03/17/2023, right shoulder pain w/ pain that radiates down the arm to back causing tingling sensation. Pt states she is still in a great deal of pain, pt states she has completed courses of medication as prescribed and nothing has helped. Pt does not recall any injury to the shoulder

## 2023-03-24 NOTE — Discharge Instructions (Signed)
The shoulder x-ray today does not show any bony abnormalities We have given you a shot of Toradol today Starting tomorrow, take ibuprofen 800 mg every 8 hours as needed for pain With no improvement in pain, follow-up with orthopedic provider-there is an [facility] down the road that is an urgent care walk-in

## 2023-03-24 NOTE — ED Provider Notes (Signed)
[facility]    CSN: [id number] Arrival date & time: 03/24/23  1213      History   Chief Complaint No chief complaint on file.   HPI [name] is a 57 y.o. female.   Patient presents today for shoulder pain follow up.  Did not call Orthopedic provider as recommended.  Reports steroid and muscle relaxant have not helped at all. Pain is severe, keeping her awake at night.  Endorses tingling going down right arm.  Reports she is in severe pain and is near tears today in examination room.  No new injury, accident, fall, or trauma to the arm.  No numbness or tingling in the fingertips or decreased sensation.  No decreased range of motion of the shoulder.    Past Medical History:  Diagnosis Date   Anxiety    Arthritis    ASCUS favor benign 09/2014, 03/2015   negative high risk HPV 09/2014   Broken ankle    Depression    Enlarged thyroid    GERD (gastroesophageal reflux disease)    Heart murmur    HTN (hypertension)     Patient Active Problem List   Diagnosis Date Noted   Moderate episode of recurrent major depressive disorder (HCC) 06/16/2022   Pure hypercholesterolemia 06/12/2021   Genital warts 07/09/2020   Prediabetes 06/14/2020   Bilateral carpal tunnel syndrome 07/22/2019   Bipolar disorder without psychotic features (HCC) 01/01/2016   Depression 01/01/2016   Daytime somnolence 01/01/2016   Dependent edema 01/01/2016   Deviated septum 01/01/2016   Generalized anxiety disorder 01/01/2016   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   Heartburn 01/01/2016   Hypertension 01/01/2016   Obesity 01/01/2016   Osteoarthritis 01/01/2016   Peripheral neuropathy 01/01/2016   Snoring 01/01/2016   Sleep disturbance 07/18/2015   Arthritis of ankle 09/07/2014   Ankle fracture, right 07/29/2014   Chronic pain syndrome 08/01/2013    Past Surgical History:  Procedure Laterality Date   ABDOMINAL HYSTERECTOMY  1993   endometriosis with pain   ABDOMINAL HYSTERECTOMY      ANKLE FRACTURE SURGERY Right    ANKLE SURGERY     CESAREAN SECTION     Neuro stimulator implant     orif lt wrist     TUBAL LIGATION  1997   TUBAL LIGATION      OB History     Gravida  2   Para  2   Term  0   Preterm  0   AB  0   Living  2      SAB  0   IAB  0   Ectopic  0   Multiple      Live Births               Home Medications    Prior to Admission medications   Medication Sig Start Date End Date Taking? Authorizing Provider  ibuprofen (ADVIL) 800 MG tablet Take 1 tablet (800 mg total) by mouth every 8 (eight) hours as needed. Take with food to prevent GI upset 03/24/23  Yes Valentino Nose, NP  ALPRAZolam Prudy Feeler) 0.5 MG tablet Take 0.5 mg by mouth at bedtime as needed for anxiety.    [provider]  DULoxetine (CYMBALTA) 30 MG capsule Take by mouth. 07/23/22   [provider]  FLUoxetine (PROZAC) 40 MG capsule Take 40 mg by mouth daily.    [provider]  furosemide (LASIX) 40 MG tablet Take 40 mg by mouth daily. Reported on  11/12/2015 03/20/15   [provider]  Multiple Vitamin (MULTIVITAMIN) tablet Take 1 tablet by mouth daily.    [provider]  omeprazole (PRILOSEC) 20 MG capsule Take 20 mg by mouth daily.    [provider]  potassium chloride (MICRO-K) 10 MEQ CR capsule Take 10 mEq by mouth daily. 03/20/15   [provider]  tiZANidine (ZANAFLEX) 4 MG tablet Take 1 tablet (4 mg total) by mouth at bedtime as needed for muscle spasms. Do not take with alcohol or while driving or operating heavy machinery.  May cause drowsiness. 03/17/23   Valentino Nose, NP  valsartan (DIOVAN) 160 MG tablet Take 160 mg by mouth daily. 06/29/22   [provider]  zolpidem (AMBIEN) 10 MG tablet Take 10 mg by mouth at bedtime as needed for sleep.    [provider]    Family History Family History  Problem Relation Age of Onset   Cancer Father        Brain tumor   Other Father         Brain tumor   Heart disease Father    Healthy Mother    Breast cancer Neg Hx     Social History Social History   Tobacco Use   Smoking status: Former    Packs/day: 1    Types: Cigarettes   Smokeless tobacco: Never   Tobacco comments:    uses e-sig 01/08/16  Vaping Use   Vaping Use: Every day  Substance Use Topics   Alcohol use: Yes    Comment: Occas   Drug use: No     Allergies   Lisinopril   Review of Systems Review of Systems Per HPI  Physical Exam Triage Vital Signs ED Triage Vitals  Enc Vitals Group     BP 03/24/23 1241 137/78     Pulse Rate 03/24/23 1241 (!) 117     Resp 03/24/23 1241 16     Temp 03/24/23 1241 98 F (36.7 C)     Temp Source 03/24/23 1241 Oral     SpO2 03/24/23 1241 97 %     Weight --      Height --      Head Circumference --      Peak Flow --      Pain Score 03/24/23 1242 10     Pain Loc --      Pain Edu? --      Excl. in GC? --    No data found.  Updated Vital Signs BP 137/78 (BP Location: Right Arm)   Pulse (!) 117   Temp 98 F (36.7 C) (Oral)   Resp 16   SpO2 97%   Visual Acuity Right Eye Distance:   Left Eye Distance:   Bilateral Distance:    Right Eye Near:   Left Eye Near:    Bilateral Near:     Physical Exam Vitals and nursing note reviewed.  Constitutional:      General: She is not in acute distress.    Appearance: Normal appearance. She is not toxic-appearing.  HENT:     Mouth/Throat:     Mouth: Mucous membranes are moist.     Pharynx: Oropharynx is clear.  Pulmonary:     Effort: Pulmonary effort is normal. No respiratory distress.  Musculoskeletal:       Arms:     Comments: Inspection: no swelling, obvious deformity, redness, or bruising to the right neck, shoulder, back, or upper arm Palpation: right trapezius tender  to palpation in approximately area marked; no obvious deformities palpates ROM: Full ROM to right shoulder, upper arm. Strength: 5/5 bilateral upper extremities Neurovascular:  neurovascularly intact in left and right upper extremity  Skin:    General: Skin is warm and dry.     Capillary Refill: Capillary refill takes less than 2 seconds.     Coloration: Skin is not jaundiced or pale.     Findings: No erythema.  Neurological:     Mental Status: She is alert and oriented to person, place, and time.  Psychiatric:        Behavior: Behavior is cooperative.      UC Treatments / Results  Labs (all labs ordered are listed, but only abnormal results are displayed) Labs Reviewed - No data to display  EKG   Radiology DG Shoulder Right  Result Date: 03/24/2023 CLINICAL DATA:  Right shoulder pain for 1 week. EXAM: RIGHT SHOULDER - 2+ VIEW COMPARISON:  08/16/2020 FINDINGS: Examination limited by significant artifact. The glenohumeral and AC joints are maintained. No acute bony findings or abnormal soft tissue calcifications. The visualized ribs are intact and the visualized right lung is grossly clear. IMPRESSION: 1. Limited examination due to artifact. 2. No acute bony findings or significant degenerative changes. Electronically Signed   By: Rudie Meyer M.D.   On: 03/24/2023 13:11    Procedures Procedures (including critical care time)  Medications Ordered in UC Medications  ketorolac (TORADOL) 30 MG/ML injection 30 mg (30 mg Intramuscular Given 03/24/23 1313)    Initial Impression / Assessment and Plan / UC Course  I have reviewed the triage vital signs and the nursing notes.  Pertinent labs & imaging results that were available during my care of the patient were reviewed by me and considered in my medical decision making (see chart for details).   Patient is well-appearing, normotensive, afebrile, not tachypneic, oxygenating well on room air.  Patient is mildly tachycardic today in triage.  1. Strain of right trapezius muscle, subsequent encounter X-ray imaging today is negative for acute bony abnormality Toradol 30 mg IM given today in urgent care with  some improvement in pain Will plan to treat with NSAIDs-ibuprofen sent to pharmacy Recommended close follow-up with orthopedic provider with no improvement or worsening symptoms despite treatment and contact information provided for local orthopedic urgent care walk-in  The patient was given the opportunity to ask questions.  All questions answered to their satisfaction.  The patient is in agreement to this plan.    Final Clinical Impressions(s) / UC Diagnoses   Final diagnoses:  Strain of right trapezius muscle, subsequent encounter     Discharge Instructions      The shoulder x-ray today does not show any bony abnormalities We have given you a shot of Toradol today Starting tomorrow, take ibuprofen 800 mg every 8 hours as needed for pain With no improvement in pain, follow-up with orthopedic provider-there is an [facility] clinic down the road that is an urgent care walk-in     ED Prescriptions     Medication Sig Dispense Auth. Provider   ibuprofen (ADVIL) 800 MG tablet Take 1 tablet (800 mg total) by mouth every 8 (eight) hours as needed. Take with food to prevent GI upset 21 tablet Valentino Nose, NP      PDMP not reviewed this encounter.   Valentino Nose, NP 03/24/23 [phone]

## 2023-03-26 ENCOUNTER — Emergency Department (HOSPITAL_BASED_OUTPATIENT_CLINIC_OR_DEPARTMENT_OTHER)
Admission: EM | Admit: 2023-03-26 | Discharge: 2023-03-26 | Disposition: A | Discharge status: Home / Self Care | Payer: Medicare HMO | Attending: Emergency Medicine | Admitting: Emergency Medicine

## 2023-03-26 ENCOUNTER — Emergency Department (HOSPITAL_BASED_OUTPATIENT_CLINIC_OR_DEPARTMENT_OTHER): Payer: Medicare HMO

## 2023-03-26 ENCOUNTER — Other Ambulatory Visit: Payer: Self-pay

## 2023-03-26 ENCOUNTER — Encounter (HOSPITAL_BASED_OUTPATIENT_CLINIC_OR_DEPARTMENT_OTHER): Payer: Self-pay

## 2023-03-26 DIAGNOSIS — M79601 Pain in right arm: Secondary | ICD-10-CM | POA: Diagnosis not present

## 2023-03-26 DIAGNOSIS — M792 Neuralgia and neuritis, unspecified: Secondary | ICD-10-CM

## 2023-03-26 DIAGNOSIS — M62838 Other muscle spasm: Secondary | ICD-10-CM | POA: Insufficient documentation

## 2023-03-26 DIAGNOSIS — M542 Cervicalgia: Secondary | ICD-10-CM | POA: Diagnosis present

## 2023-03-26 MED ORDER — OXYCODONE-ACETAMINOPHEN 5-325 MG PO TABS
1.0000 | ORAL_TABLET | Freq: Once | ORAL | Status: AC
  Administered 2023-03-26: 1 via ORAL
  Filled 2023-03-26: qty 1

## 2023-03-26 MED ORDER — OXYCODONE-ACETAMINOPHEN 5-325 MG PO TABS: 1.0000 | ORAL_TABLET | Freq: Four times a day (QID) | ORAL | 0 refills | Status: DC | PRN

## 2023-03-26 NOTE — ED Provider Notes (Signed)
[place] EMERGENCY DEPARTMENT AT [facility] Provider Note   CSN: [id number] Arrival date & time: 03/26/23  1818     History  Chief Complaint  Patient presents with   Shoulder Pain    [name] is a 57 y.o. female.  The history is provided by the patient and medical records. No language interpreter was used.  Shoulder Pain Associated symptoms: neck pain   Associated symptoms: no back pain, no fatigue and no fever   Illness Location:  Pain in right neck going down towards her right arm with some tingling. Severity:  Severe Onset quality:  Gradual Duration:  4 weeks Timing:  Constant Progression:  Worsening Chronicity:  New Associated symptoms: no abdominal pain, no chest pain, no congestion, no cough, no diarrhea, no fatigue, no fever, no headaches, no loss of consciousness, no myalgias, no nausea, no rash, no shortness of breath and no vomiting        Home Medications Prior to Admission medications   Medication Sig Start Date End Date Taking? Authorizing Provider  ALPRAZolam Prudy Feeler) 0.5 MG tablet Take 0.5 mg by mouth at bedtime as needed for anxiety.    [provider]  DULoxetine (CYMBALTA) 30 MG capsule Take by mouth. 07/23/22   [provider]  FLUoxetine (PROZAC) 40 MG capsule Take 40 mg by mouth daily.    [provider]  furosemide (LASIX) 40 MG tablet Take 40 mg by mouth daily. Reported on 11/12/2015 03/20/15   [provider]  ibuprofen (ADVIL) 800 MG tablet Take 1 tablet (800 mg total) by mouth every 8 (eight) hours as needed. Take with food to prevent GI upset 03/24/23   Valentino Nose, NP  Multiple Vitamin (MULTIVITAMIN) tablet Take 1 tablet by mouth daily.    [provider]  omeprazole (PRILOSEC) 20 MG capsule Take 20 mg by mouth daily.    [provider]  potassium chloride (MICRO-K) 10 MEQ CR capsule Take 10 mEq by mouth daily. 03/20/15   [provider]  tiZANidine (ZANAFLEX) 4  MG tablet Take 1 tablet (4 mg total) by mouth at bedtime as needed for muscle spasms. Do not take with alcohol or while driving or operating heavy machinery.  May cause drowsiness. 03/17/23   Valentino Nose, NP  valsartan (DIOVAN) 160 MG tablet Take 160 mg by mouth daily. 06/29/22   [provider]  zolpidem (AMBIEN) 10 MG tablet Take 10 mg by mouth at bedtime as needed for sleep.    [provider]      Allergies    Lisinopril    Review of Systems   Review of Systems  Constitutional:  Negative for chills, fatigue and fever.  HENT:  Negative for congestion.   Eyes:  Negative for visual disturbance.  Respiratory:  Negative for cough, chest tightness and shortness of breath.   Cardiovascular:  Negative for chest pain, palpitations and leg swelling.  Gastrointestinal:  Negative for abdominal pain, constipation, diarrhea, nausea and vomiting.  Genitourinary:  Negative for flank pain.  Musculoskeletal:  Positive for neck pain. Negative for back pain, myalgias and neck stiffness.  Skin:  Negative for rash and wound.  Neurological:  Positive for numbness (tingling intermittnetly). Negative for seizures, loss of consciousness, speech difficulty, weakness, light-headedness and headaches.  Psychiatric/Behavioral:  Negative for agitation.   All other systems reviewed and are negative.   Physical Exam Updated Vital Signs BP (!) 151/89 (BP Location: Left Arm)   Pulse 98   Temp Marland Kitchen)  97.5 F (36.4 C) (Temporal)   Resp 18   Ht 5\' 6"  (1.676 m)   Wt 95.7 kg   SpO2 97%   BMI 34.05 kg/m  Physical Exam Vitals and nursing note reviewed.  Constitutional:      General: She is not in acute distress.    Appearance: She is well-developed. She is not ill-appearing, toxic-appearing or diaphoretic.  HENT:     Head: Normocephalic and atraumatic.     Nose: No congestion or rhinorrhea.     Mouth/Throat:     Mouth: Mucous membranes are moist.     Pharynx: No oropharyngeal exudate or  posterior oropharyngeal erythema.  Eyes:     Extraocular Movements: Extraocular movements intact.     Conjunctiva/sclera: Conjunctivae normal.     Pupils: Pupils are equal, round, and reactive to light.  Neck:     Vascular: No carotid bruit.      Comments: Tenderness and spasm to right posterior paraspinal neck and ear towards shoulder.  Positive Spurling test.  No midline tenderness.  No crepitance.  No rash to suggest shingles.  Hoffmann sign negative in right hand.  Intact sensation, strength, and pulses.  No numbness or tingling on exam. Cardiovascular:     Rate and Rhythm: Normal rate and regular rhythm.     Heart sounds: No murmur heard. Pulmonary:     Effort: Pulmonary effort is normal. No respiratory distress.     Breath sounds: Normal breath sounds. No wheezing, rhonchi or rales.  Chest:     Chest wall: No tenderness.  Abdominal:     Palpations: Abdomen is soft.     Tenderness: There is no abdominal tenderness. There is no right CVA tenderness, left CVA tenderness, guarding or rebound.  Musculoskeletal:        General: Tenderness present. No swelling.     Cervical back: Neck supple. Tenderness present.     Right lower leg: No edema.     Left lower leg: No edema.  Skin:    General: Skin is warm and dry.     Capillary Refill: Capillary refill takes less than 2 seconds.     Findings: No erythema or rash.  Neurological:     General: No focal deficit present.     Mental Status: She is alert.     Cranial Nerves: No cranial nerve deficit.     Sensory: No sensory deficit.     Motor: No weakness.  Psychiatric:        Mood and Affect: Mood normal.     ED Results / Procedures / Treatments   Labs (all labs ordered are listed, but only abnormal results are displayed) Labs Reviewed - No data to display  EKG None  Radiology CT Cervical Spine Wo Contrast  Result Date: 03/26/2023 CLINICAL DATA:  Neck pain EXAM: CT CERVICAL SPINE WITHOUT CONTRAST TECHNIQUE: Multidetector CT  imaging of the cervical spine was performed without intravenous contrast. Multiplanar CT image reconstructions were also generated. RADIATION DOSE REDUCTION: This exam was performed according to the departmental dose-optimization program which includes automated exposure control, adjustment of the mA and/or kV according to patient size and/or use of iterative reconstruction technique. COMPARISON:  None Available. FINDINGS: Alignment: Straightening of the cervical spine. No subluxation. Facet alignment is within normal limits Skull base and vertebrae: No acute fracture. No primary bone lesion or focal pathologic process. Soft tissues and spinal canal: No prevertebral fluid or swelling. No visible canal hematoma. Disc levels: Moderate disc space narrowing C4-C5, C5-C6  and C6-C7. Facet degenerative changes at multiple levels. Mild bilateral foraminal narrowing C4 through C7. Upper chest: Negative. Other: None IMPRESSION: Straightening of the cervical spine with degenerative changes. No acute osseous abnormality. Electronically Signed   By: Jasmine Pang M.D.   On: 03/26/2023 23:27    Procedures Procedures    Medications Ordered in ED Medications  oxyCODONE-acetaminophen (PERCOCET/ROXICET) 5-325 MG per tablet 1 tablet (1 tablet Oral Given 03/26/23 2323)    ED Course/ Medical Decision Making/ A&P                             Medical Decision Making Amount and/or Complexity of Data Reviewed Radiology: ordered.  Risk Prescription drug management.    CARNELLA TIENDA is a 57 y.o. female with a past medical history significant for bipolar disorder, hypertension, arthritis, thyroid disease, GERD, and previous ankle fracture/surgery who presents with worsening and continued right neck and shoulder pain.  According to patient, for the last 4 weeks she has had this pain that has not been responding to conservative management with injections, steroids, muscle relaxants, Lidoderm patches, and outpatient  management.  She reports there was no focal trauma but has had pain going from her right neck into her right shoulder area down her right arm with some intermittent tingling.  No weakness reported.  She reports she cannot get MRI due to previous metal implants.  She reports no fevers, chills, congestion, or cough.  She reports no chest pain or shortness of breath.  She reports the pain is severe and is not improved with medications and she reports oxycodone has helped her in the past.  She denies other trauma.  On exam, lungs clear and chest nontender.  Abdomen nontender.  Midline back and neck was nontender but she did have tenderness in her right paraspinal neck.  Spurling was positive causing radicular pain with positive forced down onto her neck.  Hoffmann sign negative in her hands for myelopathy.  Intact sensation strength and pulses with no numbness or tingling on my exam.  Patient had tenderness in the shoulder but she had an x-ray several days ago that showed no acute fracture.  We agreed to hold on repeat x-ray of the shoulder.  As patient is on imaging of her neck, we agreed to get CT scan to rule out large fractures or some acute bony abnormality.  It showed arthritis and straightening likely from the spasm but otherwise no acute abnormality seen.  Patient given a pain pill that seemed to help and will give her a short prescription for pain medication to escalate her outpatient regimen does not seem to help.  We recommended follow-up with outpatient neurosurgery and she agrees.  Low suspicion for infection based on symptoms and patient agrees with plan of care.  Patient is to return precautions and was discharged in good condition with outpatient follow-up.         Final Clinical Impression(s) / ED Diagnoses Final diagnoses:  Neck pain  Muscle spasm  Radicular pain in right arm    Rx / DC Orders ED Discharge Orders          Ordered    oxyCODONE-acetaminophen (PERCOCET/ROXICET)  5-325 MG tablet  Every 6 hours PRN        03/26/23 2338           Clinical Impression: 1. Neck pain   2. Muscle spasm   3. Radicular pain in right arm  Disposition: Discharge  Condition: Good  I have discussed the results, Dx and Tx plan with the pt(& family if present). He/she/they expressed understanding and agree(s) with the plan. Discharge instructions discussed at great length. Strict return precautions discussed and pt &/or family have verbalized understanding of the instructions. No further questions at time of discharge.    Discharge Medication List as of 03/26/2023 11:41 PM     START taking these medications   Details  oxyCODONE-acetaminophen (PERCOCET/ROXICET) 5-325 MG tablet Take 1 tablet by mouth every 6 (six) hours as needed for severe pain., Starting Thu 03/26/2023, Normal        Follow Up: [place], [facility] [address] [place] [address] [phone]     [facility] [address] [place] [address] [place] [address] [phone] Schedule an appointment as soon as possible for a visit       Fedra Lanter, Canary Brim, MD 03/27/23 [phone]

## 2023-03-26 NOTE — Discharge Instructions (Signed)
Your history, exam, and evaluation today are consistent with nerve pain coming from your neck going down your shoulder towards your arm.  The CT scan showed arthritis but no evidence of acute fracture.  You did have spasm and tenderness on exam so we do agree with escalating your outpatient pain regimen to some Percocet from the different muscle relaxants and numbing patches you have tried for the last few weeks.  The x-ray several days ago on your shoulder did not show acute fracture so we agreed to hold on repeat shoulder x-ray.  Your exam was otherwise reassuring and we feel you are safe for discharge home for outpatient neurosurgery follow-up.  If any symptoms change or worsen acutely, please return to the nearest emergency department.

## 2023-03-26 NOTE — ED Triage Notes (Signed)
Patient here POV from Home.  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 Given IM Steroid and course of Steroids at that time. Pain worsened even despite IM Toradol. Seeks Evaluation for continued pain. Worse with Movement especially at work.   NAD Noted during Triage. A&Ox4. GCS 15. Ambulatory.

## 2023-03-26 NOTE — ED Provider Notes (Incomplete)
[place] EMERGENCY DEPARTMENT AT [facility] Provider Note   CSN: [id number] Arrival date & time: 03/26/23  1818     History {Add pertinent medical, surgical, social history, OB history to HPI:1} Chief Complaint  Patient presents with  . Shoulder Pain    [name] is a 57 y.o. female.   Shoulder Pain      Home Medications Prior to Admission medications   Medication Sig Start Date End Date Taking? Authorizing Provider  ALPRAZolam Prudy Feeler) 0.5 MG tablet Take 0.5 mg by mouth at bedtime as needed for anxiety.    [provider]  DULoxetine (CYMBALTA) 30 MG capsule Take by mouth. 07/23/22   [provider]  FLUoxetine (PROZAC) 40 MG capsule Take 40 mg by mouth daily.    [provider]  furosemide (LASIX) 40 MG tablet Take 40 mg by mouth daily. Reported on 11/12/2015 03/20/15   [provider]  ibuprofen (ADVIL) 800 MG tablet Take 1 tablet (800 mg total) by mouth every 8 (eight) hours as needed. Take with food to prevent GI upset 03/24/23   Valentino Nose, NP  Multiple Vitamin (MULTIVITAMIN) tablet Take 1 tablet by mouth daily.    [provider]  omeprazole (PRILOSEC) 20 MG capsule Take 20 mg by mouth daily.    [provider]  potassium chloride (MICRO-K) 10 MEQ CR capsule Take 10 mEq by mouth daily. 03/20/15   [provider]  tiZANidine (ZANAFLEX) 4 MG tablet Take 1 tablet (4 mg total) by mouth at bedtime as needed for muscle spasms. Do not take with alcohol or while driving or operating heavy machinery.  May cause drowsiness. 03/17/23   Valentino Nose, NP  valsartan (DIOVAN) 160 MG tablet Take 160 mg by mouth daily. 06/29/22   [provider]  zolpidem (AMBIEN) 10 MG tablet Take 10 mg by mouth at bedtime as needed for sleep.    [provider]      Allergies    Lisinopril    Review of Systems   Review of Systems  Physical Exam Updated Vital Signs BP (!) 151/89 (BP  Location: Left Arm)   Pulse 98   Temp (!) 97.5 F (36.4 C) (Temporal)   Resp 18   Ht 5\' 6"  (1.676 m)   Wt 95.7 kg   SpO2 97%   BMI 34.05 kg/m  Physical Exam  ED Results / Procedures / Treatments   Labs (all labs ordered are listed, but only abnormal results are displayed) Labs Reviewed - No data to display  EKG None  Radiology No results found.  Procedures Procedures  {Document cardiac monitor, telemetry assessment procedure when appropriate:1}  Medications Ordered in ED Medications - No data to display  ED Course/ Medical Decision Making/ A&P   {   Click here for ABCD2, HEART and other calculatorsREFRESH Note before signing :1}                          Medical Decision Making Amount and/or Complexity of Data Reviewed Radiology: ordered.  Risk Prescription drug management.    [name] is a 57 y.o. female      {Document critical care time when appropriate:1} {Document review of labs and clinical decision tools ie heart score, Chads2Vasc2 etc:1}  {Document your independent review of radiology images, and any outside records:1} {Document your discussion with family members, caretakers, and with consultants:1} {Document social determinants of health affecting pt's care:1} {Document your  decision making why or why not admission, treatments were needed:1} Final Clinical Impression(s) / ED Diagnoses Final diagnoses:  None    Rx / DC Orders ED Discharge Orders     None

## 2023-03-31 ENCOUNTER — Encounter (HOSPITAL_COMMUNITY): Payer: Self-pay

## 2023-03-31 ENCOUNTER — Emergency Department (HOSPITAL_COMMUNITY)
Admission: EM | Admit: 2023-03-31 | Discharge: 2023-03-31 | Disposition: A | Discharge status: Home / Self Care | Payer: Medicare HMO | Attending: Emergency Medicine | Admitting: Emergency Medicine

## 2023-03-31 DIAGNOSIS — M542 Cervicalgia: Secondary | ICD-10-CM

## 2023-03-31 DIAGNOSIS — M5412 Radiculopathy, cervical region: Secondary | ICD-10-CM | POA: Insufficient documentation

## 2023-03-31 MED ORDER — OXYCODONE-ACETAMINOPHEN 5-325 MG PO TABS: 1.0000 | ORAL_TABLET | Freq: Four times a day (QID) | ORAL | 0 refills | Status: DC | PRN

## 2023-03-31 MED ORDER — OXYCODONE-ACETAMINOPHEN 5-325 MG PO TABS
1.0000 | ORAL_TABLET | Freq: Once | ORAL | Status: AC
  Administered 2023-03-31: 1 via ORAL
  Filled 2023-03-31: qty 1

## 2023-03-31 MED ORDER — TIZANIDINE HCL 4 MG PO TABS: 4.0000 mg | ORAL_TABLET | Freq: Every evening | ORAL | 0 refills | Status: DC | PRN

## 2023-03-31 NOTE — Discharge Instructions (Signed)
You were seen in the emergency department for shoulder pain. I think it would be most helpful to have you follow-up with neurosurgery for evaluation of radicular symptoms.  Given that you had a CT scan of your neck performed a few days ago and prior imaging of the right shoulder showing a labral tear, neurosurgery and orthopedics would be the the ideal specialist to evaluated.  I sent a referral to [place] [facility] Dr. Danielle Dess so they should reach out to you to schedule you for an evaluation.  Return to the emergency department if her symptoms are worsening.

## 2023-03-31 NOTE — ED Triage Notes (Signed)
R neck shoulder pain radiating into back and arm. Chronic issue that is interfering with ADLs and progressively getting worse.  Unable to have MRI due to hardware. CT negative. Old rotator cuff injury that went untreated.

## 2023-03-31 NOTE — ED Provider Notes (Signed)
[place] EMERGENCY DEPARTMENT AT [facility] Provider Note   CSN: [id number] Arrival date & time: 03/31/23  1330     History Chief Complaint  Patient presents with   Shoulder Pain    [name] is a 57 y.o. female.  Patient presents emergency department complaints of right shoulder pain.  She reports that she has been experiencing radiating right-sided neck pain into the shoulder.  Denies any chest pain or shortness of breath with this.  She reports that she was seen in the emergency department a few days ago for similar concerns and had a CT scan at that time which was largely reassuring with no acute findings noted.  Patient does have a prior history of a torn rotator cuff that she states was evaluated with a CT scan about 5 years ago.  She has not had any treatment for this injury previously.  Does report that current pain feels like it comes from her neck with radiation towards the right shoulder.  Has tried a course of prednisone, muscle relaxers and pain medications without significant improvement in symptoms.  Patient has not been evaluated by neurosurgery or orthopedics for these concerns.  Denies any numbness or weakness in the affected extremity.  Also had x-ray performed on 5/7 which did not appear to show any acute abnormality but was somewhat limited due to artifact.   Shoulder Pain      Home Medications Prior to Admission medications   Medication Sig Start Date End Date Taking? Authorizing Provider  ALPRAZolam Prudy Feeler) 0.5 MG tablet Take 0.5 mg by mouth at bedtime as needed for anxiety.    [provider]  DULoxetine (CYMBALTA) 30 MG capsule Take by mouth. 07/23/22   [provider]  FLUoxetine (PROZAC) 40 MG capsule Take 40 mg by mouth daily.    [provider]  furosemide (LASIX) 40 MG tablet Take 40 mg by mouth daily. Reported on 11/12/2015 03/20/15   [provider]  ibuprofen (ADVIL) 800 MG tablet Take 1 tablet (800  mg total) by mouth every 8 (eight) hours as needed. Take with food to prevent GI upset 03/24/23   Valentino Nose, NP  Multiple Vitamin (MULTIVITAMIN) tablet Take 1 tablet by mouth daily.    [provider]  omeprazole (PRILOSEC) 20 MG capsule Take 20 mg by mouth daily.    [provider]  oxyCODONE-acetaminophen (PERCOCET/ROXICET) 5-325 MG tablet Take 1 tablet by mouth every 6 (six) hours as needed for severe pain. 03/31/23   Smitty Knudsen, PA-C  potassium chloride (MICRO-K) 10 MEQ CR capsule Take 10 mEq by mouth daily. 03/20/15   [provider]  tiZANidine (ZANAFLEX) 4 MG tablet Take 1 tablet (4 mg total) by mouth at bedtime as needed for muscle spasms. Do not take with alcohol or while driving or operating heavy machinery.  May cause drowsiness. 03/31/23   Smitty Knudsen, PA-C  valsartan (DIOVAN) 160 MG tablet Take 160 mg by mouth daily. 06/29/22   [provider]  zolpidem (AMBIEN) 10 MG tablet Take 10 mg by mouth at bedtime as needed for sleep.    [provider]      Allergies    Lisinopril    Review of Systems   Review of Systems  Musculoskeletal:        Right shoulder pain  All other systems reviewed and are negative.   Physical Exam Updated Vital Signs BP (!) 144/78   Pulse 75   Temp 97.9  F (36.6 C) (Oral)   Resp 18   SpO2 97%  Physical Exam Vitals and nursing note reviewed.  Constitutional:      General: She is not in acute distress.    Appearance: She is well-developed.  HENT:     Head: Normocephalic and atraumatic.  Eyes:     Conjunctiva/sclera: Conjunctivae normal.  Cardiovascular:     Rate and Rhythm: Normal rate and regular rhythm.     Heart sounds: No murmur heard. Pulmonary:     Effort: Pulmonary effort is normal. No respiratory distress.     Breath sounds: Normal breath sounds.  Abdominal:     Palpations: Abdomen is soft.     Tenderness: There is no abdominal tenderness.  Musculoskeletal:        General:  Tenderness present. No swelling, deformity or signs of injury.     Cervical back: Neck supple.     Comments: Limited range of motion in right shoulder due to pain.  Positive liftoff sign.  Weakness with external/internal rotation of the right shoulder when compared to left.  Worsening of radiating pain with rotation of cervical spine.  Skin:    General: Skin is warm and dry.     Capillary Refill: Capillary refill takes less than 2 seconds.  Neurological:     Mental Status: She is alert.  Psychiatric:        Mood and Affect: Mood normal.     ED Results / Procedures / Treatments   Labs (all labs ordered are listed, but only abnormal results are displayed) Labs Reviewed - No data to display  EKG None  Radiology No results found.  Procedures Procedures   Medications Ordered in ED Medications  oxyCODONE-acetaminophen (PERCOCET/ROXICET) 5-325 MG per tablet 1 tablet (1 tablet Oral Given 03/31/23 1430)    ED Course/ Medical Decision Making/ A&P                           Medical Decision Making Risk Prescription drug management.   This patient presents to the ED for concern of shoulder pain.  Differential diagnosis includes labral tear, cervical radiculopathy, shingles, MS    Medicines ordered and prescription drug management:  I ordered medication including Percocet for pain Reevaluation of the patient after these medicines showed that the patient improved I have reviewed the patients home medicines and have made adjustments as needed   Problem List / ED Course:  Patient presents emergency department complaints of right neck/shoulder pain.  Reports the pain radiates into her arm.  Was previously evaluated for similar concerns few days ago and had a CT scan of the cervical spine performed at that time.  Also reports that she previously been told that she has a labral tear but has not sought any evaluation or treatment from orthopedics.  Patient has not been evaluated by  neurosurgery for cervical radiculopathy symptoms.  States that she has had symptomatic control with Percocet and tizanidine is requesting refills on these medications.  Given largely reassuring physical exam with evidence of cervical radiculopathy as well as suspected rotator cuff injury, refills of patient's medication sent to pharmacy.  Advised patient that she needs to follow-up with neurosurgery and orthopedics.  Ambulatory referral made for neurosurgery.  Encourage patient return to the emergency department if her symptoms are worsening. Reevaluated patient after dose of Percocet.  Pain resolved.  Patient agreeable treatment plan verbalized understanding all return precautions.  Final Clinical Impression(s) / ED Diagnoses  Final diagnoses:  Neck pain  Cervical radiculopathy    Rx / DC Orders ED Discharge Orders          Ordered    oxyCODONE-acetaminophen (PERCOCET/ROXICET) 5-325 MG tablet  Every 6 hours PRN,   Status:  Discontinued        03/31/23 1601    tiZANidine (ZANAFLEX) 4 MG tablet  At bedtime PRN,   Status:  Discontinued        03/31/23 1601    Ambulatory referral to Neurosurgery        03/31/23 1601    oxyCODONE-acetaminophen (PERCOCET/ROXICET) 5-325 MG tablet  Every 6 hours PRN        03/31/23 1637    tiZANidine (ZANAFLEX) 4 MG tablet  At bedtime PRN        03/31/23 1637              Smitty Knudsen, PA-C 03/31/23 1843    Melene Plan, DO 04/06/23 1455

## 2023-04-17 ENCOUNTER — Other Ambulatory Visit: Payer: Self-pay | Admitting: Neurosurgery

## 2023-04-17 DIAGNOSIS — S43431D Superior glenoid labrum lesion of right shoulder, subsequent encounter: Secondary | ICD-10-CM

## 2023-04-23 ENCOUNTER — Other Ambulatory Visit: Payer: Self-pay

## 2023-04-23 ENCOUNTER — Ambulatory Visit (INDEPENDENT_AMBULATORY_CARE_PROVIDER_SITE_OTHER): Payer: Medicare HMO | Admitting: Orthopedic Surgery

## 2023-04-23 ENCOUNTER — Encounter: Payer: Self-pay | Admitting: Orthopedic Surgery

## 2023-04-23 DIAGNOSIS — M25511 Pain in right shoulder: Secondary | ICD-10-CM | POA: Diagnosis not present

## 2023-04-23 DIAGNOSIS — S43431A Superior glenoid labrum lesion of right shoulder, initial encounter: Secondary | ICD-10-CM

## 2023-04-23 MED ORDER — METHYLPREDNISOLONE ACETATE 40 MG/ML IJ SUSP
40.0000 mg | INTRAMUSCULAR | Status: AC | PRN
Start: 2023-04-23 — End: 2023-04-23
  Administered 2023-04-23: 40 mg via INTRA_ARTICULAR

## 2023-04-23 MED ORDER — LIDOCAINE HCL 1 % IJ SOLN
5.0000 mL | INTRAMUSCULAR | Status: AC | PRN
Start: 2023-04-23 — End: 2023-04-23
  Administered 2023-04-23: 5 mL

## 2023-04-23 MED ORDER — BUPIVACAINE HCL 0.5 % IJ SOLN
9.0000 mL | INTRAMUSCULAR | Status: AC | PRN
Start: 2023-04-23 — End: 2023-04-23
  Administered 2023-04-23: 9 mL via INTRA_ARTICULAR

## 2023-04-23 NOTE — Progress Notes (Signed)
Office Visit Note   Patient: [name]           Date of Birth: 1966-07-30           MRN: [id number] Visit Date: 04/23/2023 Requested by: [facility] [address] [place],  [place] [address] PCP: [facility]  Subjective: Chief Complaint  Patient presents with   Right Shoulder - Pain    HPI: KIMBRLY FERWERDA is a 57 y.o. female who presents to the office reporting right shoulder pain.  Denies any history of injury.  In 2016 she had a CT arthrogram which showed a posterior labral tear from the 9:00 to 1 o'clock position.  She states that holding her arm up is painful.  She has been dropping things.  Gets some tingling in her fingers as well.  Her recurrent pain started in early May.  She did have a cortisone injection about 4 years ago which gave her relief.  She does have a spinal cord stimulator.  Does report some neck pain and symptoms as well..                ROS: All systems reviewed are negative as they relate to the chief complaint within the history of present illness.  Patient denies fevers or chills.  Assessment & Plan: Visit Diagnoses:  1. Right shoulder pain, unspecified chronicity     Plan: Impression is right shoulder pain with fairly underwhelming pathology 6 years ago on CT arthrogram.  She cannot have an MRI arthrogram.  She does have some mechanical symptoms on exam today.  Had good relief from an injection.  Plan is ultrasound-guided glenohumeral joint injection with 6-week return and decision for or against further intervention at that time.  I think it is possible that she could have some labral pathology particularly biceps pathology which could be giving her symptoms but whether that rises to the level of requiring intervention is unclear at this time.  I do not think any of her symptoms below the elbow are related to her shoulder or labral pathology.  Follow-Up Instructions: No  follow-ups on file.   Orders:  Orders Placed This Encounter  Procedures   US Guided Needle Placement - No Linked Charges   No orders of the defined types were placed in this encounter.     Procedures: Large Joint Inj: R glenohumeral on 04/23/2023 7:47 PM Indications: diagnostic evaluation and pain Details: 22 G 1.5 in needle, ultrasound-guided posterior approach  Arthrogram: No  Medications: 9 mL bupivacaine 0.5 %; 40 mg methylPREDNISolone acetate 40 MG/ML; 5 mL lidocaine 1 % Outcome: tolerated well, no immediate complications Procedure, treatment alternatives, risks and benefits explained, specific risks discussed. Consent was given by the patient. Immediately prior to procedure a time out was called to verify the correct patient, procedure, equipment, support staff and site/side marked as required. Patient was prepped and draped in the usual sterile fashion.       Clinical Data: No additional findings.  Objective: Vital Signs: There were no vitals taken for this visit.  Physical Exam:  Constitutional: Patient appears well-developed HEENT:  Head: Normocephalic Eyes:EOM are normal Neck: Normal range of motion Cardiovascular: Normal rate Pulmonary/chest: Effort normal Neurologic: Patient is alert Skin: Skin is warm Psychiatric: Patient has normal mood and affect  Ortho Exam: Ortho exam demonstrates pretty reasonable cervical spine range of motion.  No paresthesias C5-T1.  She has 5 out of 5 grip EPL FPL interosseous  wrist flexion extension bicep triceps and deltoid strength.  Positive grind testing on the right negative on the left.  Mild AC joint tenderness on the right none on the left.  She has good rotator cuff strength infraspinatus supraspinatus and subscap muscle testing.  She does have a little bit of popping in the shoulder which feels more intra-articular a s opposed to rotator cuff related with internal and external rotation at 90 degrees of abduction along with  labral load testing.  Negative apprehension relocation testing for anterior posterior instability  Specialty Comments:  No specialty comments available.  Imaging: No results found.   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   Heartburn 01/01/2016   Hypertension 01/01/2016   Obesity 01/01/2016   Osteoarthritis 01/01/2016   Peripheral neuropathy 01/01/2016   Snoring 01/01/2016   Sleep disturbance 07/18/2015   Arthritis of ankle 09/07/2014   Ankle fracture, right 07/29/2014   Chronic pain syndrome 08/01/2013   Past Medical History:  Diagnosis Date   Anxiety    Arthritis    ASCUS favor benign 09/2014, 03/2015   negative high risk HPV 09/2014   Broken ankle    Depression    Enlarged thyroid    GERD (gastroesophageal reflux disease)    Heart murmur    HTN (hypertension)     Family History  Problem Relation Age of Onset   Cancer Father        Brain tumor   Other Father        Brain tumor   Heart disease Father    Healthy Mother    Breast cancer Neg Hx     Past Surgical History:  Procedure Laterality Date   ABDOMINAL HYSTERECTOMY  1993   endometriosis with pain   ABDOMINAL HYSTERECTOMY     ANKLE FRACTURE SURGERY Right    ANKLE SURGERY     CESAREAN SECTION     Neuro stimulator implant     orif lt wrist     TUBAL LIGATION  1997   TUBAL LIGATION     Social History   Occupational History   Occupation: unemployed  Tobacco Use   Smoking status: Some Days    Types: Cigarettes    Smokeless tobacco: Never   Tobacco comments:    uses e-sig 01/08/16  Vaping Use   Vaping Use: Every day  Substance and Sexual Activity   Alcohol use: Yes    Comment: Occas   Drug use: No   Sexual activity: Not Currently    Birth control/protection: Surgical    Comment: HYST

## 2023-06-08 ENCOUNTER — Ambulatory Visit (INDEPENDENT_AMBULATORY_CARE_PROVIDER_SITE_OTHER): Payer: Medicare HMO | Admitting: Orthopedic Surgery

## 2023-06-08 DIAGNOSIS — M5412 Radiculopathy, cervical region: Secondary | ICD-10-CM | POA: Diagnosis not present

## 2023-06-08 DIAGNOSIS — M25511 Pain in right shoulder: Secondary | ICD-10-CM | POA: Diagnosis not present

## 2023-06-09 ENCOUNTER — Encounter: Payer: Self-pay | Admitting: Orthopedic Surgery

## 2023-06-09 NOTE — Progress Notes (Signed)
Office Visit Note   Patient: [name]           Date of Birth: 03-31-1966           MRN: [id number] Visit Date: 06/08/2023 Requested by: [facility] [address] [place],  [place] [address] PCP: [facility]  Subjective: Chief Complaint  Patient presents with   Right Shoulder - Follow-up    HPI: [name] is a 57 y.o. female who presents to the office reporting right shoulder pain.  Patient had right glenohumeral joint injection 04/23/2023 from which she received no relief.  The pain does wake her from sleep at night.  She describes arm heaviness and she feels like something is squeezing the arm.  Reports radiating pain down to the elbow.  Also describes numbness and tingling in the arm along with occasional neck pain but no scapular pain.  Has Ultram but does not use it because is not helpful.  Tried lidocaine and Biofreeze without much help.  She does describe paresthesias in the right C5 distribution..                ROS: All systems reviewed are negative as they relate to the chief complaint within the history of present illness.  Patient denies fevers or chills.  Assessment & Plan: Visit Diagnoses:  1. Right shoulder pain, unspecified chronicity   2. Radiculopathy, cervical region     Plan: Impression is neck versus shoulder source of pain.  Cannot get an MRI scan because of implanted metal.  I think it is most likely that this is coming from her neck.  She did have a CT scan only which showed loss of lordosis but no acute osseous abnormality.  Not a great study to evaluate for right-sided C4-5 cervical disc.  Would like for her to get a diagnostic and therapeutic injection into the cervical spine to see if that would help with her symptoms.  Refer to Dr. Alvester Morin for that.  Also CT arthrogram of the right shoulder just to evaluate for rotator cuff tear even though her exam on the shoulder looks pretty  reasonable.  Follow-Up Instructions: No follow-ups on file.   Orders:  Orders Placed This Encounter  Procedures   CT SHOULDER RIGHT W CONTRAST   Arthrogram   Ambulatory referral to Physical Medicine Rehab   No orders of the defined types were placed in this encounter.     Procedures: No procedures performed   Clinical Data: No additional findings.  Objective: Vital Signs: There were no vitals taken for this visit.  Physical Exam:  Constitutional: Patient appears well-developed HEENT:  Head: Normocephalic Eyes:EOM are normal Neck: Normal range of motion Cardiovascular: Normal rate Pulmonary/chest: Effort normal Neurologic: Patient is alert Skin: Skin is warm Psychiatric: Patient has normal mood and affect  Ortho Exam: Ortho exam demonstrates pretty reasonable cervical spine range of motion flexion chin to chest extension 40 degrees rotation about 50 degrees bilaterally.  5 out of 5 grip EPL FPL interosseous wrist flexion extension bicep triceps and deltoid strength but she does have some C5 paresthesias on the right compared to the left.  Rotator cuff strength is intact infraspinatus supraspinatus and subscap muscle testing.  No coarse grinding or crepitus with internal/external rotation of that right shoulder at 90 degrees of abduction.  No discrete AC joint tenderness on the right-hand side.  Specialty Comments:  CT CERVICAL SPINE WITHOUT CONTRAST   TECHNIQUE: Multidetector CT  imaging of the cervical spine was performed without intravenous contrast. Multiplanar CT image reconstructions were also generated.   RADIATION DOSE REDUCTION: This exam was performed according to the departmental dose-optimization program which includes automated exposure control, adjustment of the mA and/or kV according to patient size and/or use of iterative reconstruction technique.   COMPARISON:  None Available.   FINDINGS: Alignment: Straightening of the cervical spine. No  subluxation. Facet alignment is within normal limits   Skull base and vertebrae: No acute fracture. No primary bone lesion or focal pathologic process.   Soft tissues and spinal canal: No prevertebral fluid or swelling. No visible canal hematoma.   Disc levels: Moderate disc space narrowing C4-C5, C5-C6 and C6-C7. Facet degenerative changes at multiple levels. Mild bilateral foraminal narrowing C4 through C7.   Upper chest: Negative.   Other: None   IMPRESSION: Straightening of the cervical spine with degenerative changes. No acute osseous abnormality.     Electronically Signed   By: Jasmine Pang M.D.   On: 03/26/2023 23:27  Imaging: No results found.   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   Heartburn 01/01/2016   Hypertension 01/01/2016   Obesity 01/01/2016   Osteoarthritis 01/01/2016   Peripheral neuropathy 01/01/2016   Snoring 01/01/2016   Sleep disturbance 07/18/2015   Arthritis of ankle 09/07/2014   Ankle fracture, right 07/29/2014   Chronic pain syndrome 08/01/2013   Past Medical History:  Diagnosis Date   Anxiety    Arthritis    ASCUS favor benign 09/2014, 03/2015   negative high risk HPV 09/2014   Broken ankle    Depression    Enlarged thyroid    GERD (gastroesophageal reflux disease)    Heart murmur    HTN (hypertension)     Family History  Problem Relation Age of Onset   Cancer Father        Brain tumor   Other Father        Brain tumor   Heart  disease Father    Healthy Mother    Breast cancer Neg Hx     Past Surgical History:  Procedure Laterality Date   ABDOMINAL HYSTERECTOMY  1993   endometriosis with pain   ABDOMINAL HYSTERECTOMY     ANKLE FRACTURE SURGERY Right    ANKLE SURGERY     CESAREAN SECTION     Neuro stimulator implant     orif lt wrist     TUBAL LIGATION  1997   TUBAL LIGATION     Social History   Occupational History   Occupation: unemployed  Tobacco Use   Smoking status: Some Days    Types: Cigarettes   Smokeless tobacco: Never   Tobacco comments:    uses e-sig 01/08/16  Vaping Use   Vaping status: Every Day  Substance and Sexual Activity   Alcohol use: Yes    Comment: Occas   Drug use: No   Sexual activity: Not Currently    Birth control/protection: Surgical    Comment: HYST

## 2023-06-12 ENCOUNTER — Telehealth: Payer: Self-pay

## 2023-06-12 DIAGNOSIS — F411 Generalized anxiety disorder: Secondary | ICD-10-CM

## 2023-06-12 MED ORDER — DIAZEPAM 5 MG PO TABS: ORAL_TABLET | ORAL | 0 refills | Status: DC

## 2023-06-12 NOTE — Addendum Note (Signed)
Addended by: Ashok Norris on: 06/12/2023 03:30 PM   Modules accepted: Orders

## 2023-06-12 NOTE — Telephone Encounter (Signed)
Patient is scheduled for injection 06/29/23. Needs pre procedure Valium sent to [facility] [address]

## 2023-06-29 ENCOUNTER — Ambulatory Visit (INDEPENDENT_AMBULATORY_CARE_PROVIDER_SITE_OTHER): Payer: Medicare HMO | Admitting: Physical Medicine and Rehabilitation

## 2023-06-29 ENCOUNTER — Other Ambulatory Visit: Payer: Self-pay

## 2023-06-29 VITALS — BP 167/89 | HR 80

## 2023-06-29 DIAGNOSIS — M5412 Radiculopathy, cervical region: Secondary | ICD-10-CM

## 2023-06-29 MED ORDER — METHYLPREDNISOLONE ACETATE 80 MG/ML IJ SUSP: 80.0000 mg | Freq: Once | INTRAMUSCULAR | Status: DC

## 2023-06-29 NOTE — Progress Notes (Signed)
Functional Pain Scale - descriptive words and definitions  Distracting (5)    Aware of pain/able to complete some ADL's but limited by pain/sleep is affected and active distractions are only slightly useful. Moderate range order  Average Pain  varies   +Driver, -BT, -Dye Allergies.  Neck pain that radiates into the right shoulder and upper back. Stretching overhead helps pain

## 2023-06-29 NOTE — Patient Instructions (Signed)

## 2023-06-29 NOTE — Progress Notes (Signed)
[name] - 57 y.o. female MRN [id number]  Date of birth: [birth year]  Office Visit Note: Visit Date: 06/29/2023 PCP: [facility] Referred by: Roe Coombs*  Subjective: Chief Complaint  Patient presents with   Neck - Pain   HPI:  [name] is a 57 y.o. female who comes in today at the request of Dr. Burnard Bunting for planned Right C7-T1 Cervical Interlaminar epidural steroid injection with fluoroscopic guidance.  The patient has failed conservative care including home exercise, medications, time and activity modification.  This injection will be diagnostic and hopefully therapeutic.  Please see requesting physician notes for further details and justification.  She did have CT scan which was reviewed below which is fairly normal for her age but clearly could miss disc protrusion etc.  Cannot have MRI.  She also has had prior electrodiagnostic study in 2020 and this was reviewed below.  This showed some level of carpal tunnel syndrome bilaterally but no cervical radiculopathy or peripheral neuropathy.  She does carry a diagnosis of peripheral neuropathy.   ROS Otherwise per HPI.  Assessment & Plan: Visit Diagnoses:    ICD-10-CM   1. Cervical radiculopathy  M54.12 XR C-ARM NO REPORT    Epidural Steroid injection    methylPREDNISolone acetate (DEPO-MEDROL) injection 80 mg      Plan: No additional findings.   Meds & Orders:  Meds ordered this encounter  Medications   methylPREDNISolone acetate (DEPO-MEDROL) injection 80 mg    Orders Placed This Encounter  Procedures   XR C-ARM NO REPORT   Epidural Steroid injection    Follow-up: Return for visit to requesting provider as needed.   Procedures: No procedures performed  Cervical Epidural Steroid Injection - Interlaminar Approach with Fluoroscopic Guidance  Patient: [name]      Date of Birth: [birth year] MRN: [id number] [facility]: [facility]      Visit Date: 06/29/2023   Universal Protocol:    Date/Time: 08/12/241:27 PM  Consent Given By: the patient  Position: PRONE  Additional Comments: Vital signs were monitored before and after the procedure. Patient was prepped and draped in the usual sterile fashion. The correct patient, procedure, and site was verified.   Injection Procedure Details:   Procedure diagnoses: Cervical radiculopathy [M54.12]    Meds Administered:  Meds ordered this encounter  Medications   methylPREDNISolone acetate (DEPO-MEDROL) injection 80 mg     Laterality: Right  Location/Site: C7-T1  Needle: 3.5 in., 20 ga. Tuohy  Needle Placement: Paramedian epidural space  Findings:  -Comments: Excellent flow of contrast into the epidural space.  Procedure Details: Using a paramedian approach from the side mentioned above, the region overlying the inferior lamina was localized under fluoroscopic visualization and the soft tissues overlying this structure were infiltrated with 4 ml. of 1% Lidocaine without Epinephrine. A # 20 gauge, Tuohy needle was inserted into the epidural space using a paramedian approach.  The epidural space was localized using loss of resistance along with contralateral oblique bi-planar fluoroscopic views.  After negative aspirate for air, blood, and CSF, a 2 ml. volume of Isovue-250 was injected into the epidural space and the flow of contrast was observed. Radiographs were obtained for documentation purposes.   The injectate was administered into the level noted above.  Additional Comments:  No complications occurred Dressing: 2 x 2 sterile gauze and Band-Aid    Post-procedure details: Patient was observed during the procedure. Post-procedure instructions were reviewed.  Patient left the clinic in stable condition.   Clinical History: CT CERVICAL SPINE WITHOUT CONTRAST   TECHNIQUE: Multidetector CT imaging of the cervical spine was performed  without intravenous contrast. Multiplanar CT image reconstructions were also generated.   RADIATION DOSE REDUCTION: This exam was performed according to the departmental dose-optimization program which includes automated exposure control, adjustment of the mA and/or kV according to patient size and/or use of iterative reconstruction technique.   COMPARISON:  None Available.   FINDINGS: Alignment: Straightening of the cervical spine. No subluxation. Facet alignment is within normal limits   Skull base and vertebrae: No acute fracture. No primary bone lesion or focal pathologic process.   Soft tissues and spinal canal: No prevertebral fluid or swelling. No visible canal hematoma.   Disc levels: Moderate disc space narrowing C4-C5, C5-C6 and C6-C7. Facet degenerative changes at multiple levels. Mild bilateral foraminal narrowing C4 through C7.   Upper chest: Negative.   Other: None   IMPRESSION: Straightening of the cervical spine with degenerative changes. No acute osseous abnormality.     Electronically Signed   By: Jasmine Pang M.D.   On: 03/26/2023 23:27  ---  06/29/19 EMG/NCS IMPRESSION:   Nerve conduction studies done on both upper extremities shows evidence of borderline carpal tunnel syndrome bilaterally.  EMG evaluation of the right upper extremity was unremarkable, no evidence of an overlying cervical radiculopathy was seen.   Marlan Palau MD 06/29/2019 1:29 PM   [facility]     Objective:  VS:  HT:    WT:   BMI:     BP:(!) 167/89  HR:80bpm  TEMP: ( )  RESP:  Physical Exam Vitals and nursing note reviewed.  Constitutional:      General: She is not in acute distress.    Appearance: Normal appearance. She is not ill-appearing.  HENT:     Head: Normocephalic and atraumatic.     Right Ear: External ear normal.     Left Ear: External ear normal.  Eyes:     Extraocular Movements: Extraocular movements intact.   Cardiovascular:     Rate and Rhythm: Normal rate.     Pulses: Normal pulses.  Musculoskeletal:     Cervical back: Tenderness present. No rigidity.     Right lower leg: No edema.     Left lower leg: No edema.     Comments: Patient has good strength in the upper extremities including 5 out of 5 strength in wrist extension long finger flexion and APB.  There is no atrophy of the hands intrinsically.  There is a negative Hoffmann's test.   Lymphadenopathy:     Cervical: No cervical adenopathy.  Skin:    Findings: No erythema, lesion or rash.  Neurological:     General: No focal deficit present.     Mental Status: She is alert and oriented to person, place, and time.     Sensory: No sensory deficit.     Motor: No weakness or abnormal muscle tone.     Coordination: Coordination normal.  Psychiatric:        Mood and Affect: Mood normal.        Behavior: Behavior normal.      Imaging: XR C-ARM NO REPORT  Result Date: 06/29/2023 Please see Notes tab for imaging impression.

## 2023-06-29 NOTE — Procedures (Signed)
Cervical Epidural Steroid Injection - Interlaminar Approach with Fluoroscopic Guidance  Patient: [name]      Date of Birth: 1966/08/15 MRN: [id number] PCP: [facility]      Visit Date: 06/29/2023   Universal Protocol:    Date/Time: 08/12/241:27 PM  Consent Given By: the patient  Position: PRONE  Additional Comments: Vital signs were monitored before and after the procedure. Patient was prepped and draped in the usual sterile fashion. The correct patient, procedure, and site was verified.   Injection Procedure Details:   Procedure diagnoses: Cervical radiculopathy [M54.12]    Meds Administered:  Meds ordered this encounter  Medications   methylPREDNISolone acetate (DEPO-MEDROL) injection 80 mg     Laterality: Right  Location/Site: C7-T1  Needle: 3.5 in., 20 ga. Tuohy  Needle Placement: Paramedian epidural space  Findings:  -Comments: Excellent flow of contrast into the epidural space.  Procedure Details: Using a paramedian approach from the side mentioned above, the region overlying the inferior lamina was localized under fluoroscopic visualization and the soft tissues overlying this structure were infiltrated with 4 ml. of 1% Lidocaine without Epinephrine. A # 20 gauge, Tuohy needle was inserted into the epidural space using a paramedian approach.  The epidural space was localized using loss of resistance along with contralateral oblique bi-planar fluoroscopic views.  After negative aspirate for air, blood, and CSF, a 2 ml. volume of Isovue-250 was injected into the epidural space and the flow of contrast was observed. Radiographs were obtained for documentation purposes.   The injectate was administered into the level noted above.  Additional Comments:  No complications occurred Dressing: 2 x 2 sterile gauze and Band-Aid    Post-procedure details: Patient was observed during the procedure. Post-procedure instructions  were reviewed.  Patient left the clinic in stable condition.

## 2023-08-19 ENCOUNTER — Telehealth: Payer: Self-pay | Admitting: Orthopedic Surgery

## 2023-08-19 NOTE — Telephone Encounter (Signed)
Pt got injection with [place] on 06/29/23 in her neck and said the shot only worked for about 2 weeks or so and would like to know what is the next course of action

## 2023-08-20 NOTE — Telephone Encounter (Signed)
LVM to return call to clinic.

## 2023-09-10 ENCOUNTER — Other Ambulatory Visit (HOSPITAL_COMMUNITY): Payer: Self-pay | Admitting: Family Medicine

## 2023-09-10 DIAGNOSIS — Z1231 Encounter for screening mammogram for malignant neoplasm of breast: Secondary | ICD-10-CM

## 2023-09-16 ENCOUNTER — Encounter (HOSPITAL_COMMUNITY): Payer: Self-pay

## 2023-09-16 ENCOUNTER — Ambulatory Visit (HOSPITAL_COMMUNITY)
Admission: RE | Admit: 2023-09-16 | Discharge: 2023-09-16 | Disposition: A | Discharge status: Home / Self Care | Payer: Medicare HMO | Source: Ambulatory Visit | Attending: Family Medicine | Admitting: Family Medicine

## 2023-09-16 DIAGNOSIS — Z1231 Encounter for screening mammogram for malignant neoplasm of breast: Secondary | ICD-10-CM | POA: Insufficient documentation

## 2023-09-28 ENCOUNTER — Ambulatory Visit (INDEPENDENT_AMBULATORY_CARE_PROVIDER_SITE_OTHER): Payer: Medicare HMO | Admitting: Surgical

## 2023-09-28 ENCOUNTER — Encounter: Payer: Self-pay | Admitting: Surgical

## 2023-09-28 DIAGNOSIS — S43431A Superior glenoid labrum lesion of right shoulder, initial encounter: Secondary | ICD-10-CM

## 2023-09-28 DIAGNOSIS — M5412 Radiculopathy, cervical region: Secondary | ICD-10-CM

## 2023-09-28 DIAGNOSIS — M25511 Pain in right shoulder: Secondary | ICD-10-CM | POA: Diagnosis not present

## 2023-09-28 NOTE — Progress Notes (Signed)
Follow-up Office Visit Note   Patient: [name]           Date of Birth: Apr 12, 1966           MRN: [id number] Visit Date: 09/28/2023 Requested by: [facility] [address] [place],  [place] [address] PCP: [facility]  Subjective: Chief Complaint  Patient presents with   Neck - Pain   Right Shoulder - Pain    HPI: [name] is a 57 y.o. female who returns to the office for follow-up visit.    Plan at last visit was: Impression is neck versus shoulder source of pain. Cannot get an MRI scan because of implanted metal. I think it is most likely that this is coming from her neck. She did have a CT scan only which showed loss of lordosis but no acute osseous abnormality. Not a great study to evaluate for right-sided C4-5 cervical disc. Would like for her to get a diagnostic and therapeutic injection into the cervical spine to see if that would help with her symptoms. Refer to Dr. Alvester Morin for that. Also CT arthrogram of the right shoulder just to evaluate for rotator cuff tear even though her exam on the shoulder looks pretty reasonable.   Since then, patient notes she had cervical spine ESI that was done on 06/29/2023 and she had increased pain for about a day afterward but 2 days later she had 75% relief of her shoulder pain.  This did well for about a month until she was reaching for the remote behind her and felt sudden return of sharp pain.  Since then she has had constant anterolateral shoulder pain without radiation.  This pain will wake her up at night.  It is not worse with overhead motion.  She also notes numbness and tingling both of the dorsal and palmar aspects of bilateral hands worst in the right hand.  She has no history of prior hand or neck surgery but she does have history of nerve conduction study done years ago demonstrating mild bilateral carpal tunnel syndrome.  She has had prior right  shoulder injection by Dr. August Saucer that gave her some relief but not nearly as much relief as the cervical spine ESI.  Not taking any medications for pain.  Feels the same as her prior pain prior to the cervical spine ESI but is not as severe and does not radiate like the other pain did.              ROS: All systems reviewed are negative as they relate to the chief complaint within the history of present illness.  Patient denies fevers or chills.  Assessment & Plan: Visit Diagnoses:  1. Cervical radiculopathy   2. Right shoulder pain, unspecified chronicity   3. 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 female who returns to the office for follow-up visit.  Plan from last visit was noted above in HPI.  They now return with significant relief of shoulder pain from cervical spine ESI up until she exacerbated her pain with reaching behind her.  Never ended up having a CT arthrogram of the right shoulder.  Most of her pain seems to be related to her neck pathology which was improved with ESI by Dr. Alvester Morin.  We will send her back for repeat ESI to see how this affects her current anterolateral constant pain  in the shoulder.  Also need to evaluate right shoulder with CT arthrogram to evaluate for SLAP tear given her exam today.  Not really a lot of rotator cuff weakness or pain exacerbated by testing her rotator cuff strength.  Follow-up after CT scan and after cervical spine ESI.  Follow-Up Instructions: No follow-ups on file.   Orders:  Orders Placed This Encounter  Procedures   CT SHOULDER RIGHT W CONTRAST   DL FLUORO GUIDED NEEDLE PLC ASPIRATION / INJECTTION/LOC   Ambulatory referral to Physical Medicine Rehab   No orders of the defined types were placed in this encounter.     Procedures: No procedures performed   Clinical Data: No additional findings.  Objective: Vital Signs: There were no vitals taken for this  visit.  Physical Exam:  Constitutional: Patient appears well-developed HEENT:  Head: Normocephalic Eyes:EOM are normal Neck: Normal range of motion Cardiovascular: Normal rate Pulmonary/chest: Effort normal Neurologic: Patient is alert Skin: Skin is warm Psychiatric: Patient has normal mood and affect  Ortho Exam: Ortho exam demonstrates right shoulder with 45 degrees X rotation, 120 degrees abduction, 180 degrees forward elevation passively and actively.  She has no pain with overhead motion of the shoulder until terminal abduction.  She does have increased pain with internal rotation of the right shoulder passively.  Intact EPL, FPL, finger abduction, grip strength testing.  Intact infra, supra, subscap strength rated 5/5.  Intact supination/pronation strength as well as bicep and tricep strength without reproduction of pain.  Axillary nerve intact with deltoid firing.  She does have tenderness over the bicipital groove moderately and AC joint mildly.  Positive O'Brien sign.  Negative Neer and Hawkins impingement signs.  She has weakly positive Lhermitte sign reproducing trapezius and shoulder pain.  She has negative Spurling sign.  Positive Tinel sign over the carpal tunnel of bilateral hands.  Positive Durkan sign.  Specialty Comments:  CT CERVICAL SPINE WITHOUT CONTRAST   TECHNIQUE: Multidetector CT imaging of the cervical spine was performed without intravenous contrast. Multiplanar CT image reconstructions were also generated.   RADIATION DOSE REDUCTION: This exam was performed according to the departmental dose-optimization program which includes automated exposure control, adjustment of the mA and/or kV according to patient size and/or use of iterative reconstruction technique.   COMPARISON:  None Available.   FINDINGS: Alignment: Straightening of the cervical spine. No subluxation. Facet alignment is within normal limits   Skull base and vertebrae: No acute fracture. No  primary bone lesion or focal pathologic process.   Soft tissues and spinal canal: No prevertebral fluid or swelling. No visible canal hematoma.   Disc levels: Moderate disc space narrowing C4-C5, C5-C6 and C6-C7. Facet degenerative changes at multiple levels. Mild bilateral foraminal narrowing C4 through C7.   Upper chest: Negative.   Other: None   IMPRESSION: Straightening of the cervical spine with degenerative changes. No acute osseous abnormality.     Electronically Signed   By: Jasmine Pang M.D.   On: 03/26/2023 23:27  ---  06/29/19 EMG/NCS IMPRESSION:   Nerve conduction studies done on both upper extremities shows evidence of borderline carpal tunnel syndrome bilaterally.  EMG evaluation of the right upper extremity was unremarkable, no evidence of an overlying cervical radiculopathy was seen.   Marlan Palau MD 06/29/2019 1:29 PM   [facility]  Imaging: No results found.   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   Heartburn 01/01/2016   Hypertension 01/01/2016   Obesity 01/01/2016   Osteoarthritis 01/01/2016   Peripheral neuropathy 01/01/2016   Snoring 01/01/2016   Sleep disturbance 07/18/2015   Arthritis of ankle 09/07/2014   Ankle fracture, right 07/29/2014   Chronic pain syndrome 08/01/2013   Past Medical History:  Diagnosis Date   Anxiety    Arthritis    ASCUS favor benign 09/2014, 03/2015   negative high risk HPV 09/2014   Broken ankle     Depression    Enlarged thyroid    GERD (gastroesophageal reflux disease)    Heart murmur    HTN (hypertension)     Family History  Problem Relation Age of Onset   Cancer Father        Brain tumor   Other Father        Brain tumor   Heart disease Father    Healthy Mother    Breast cancer Neg Hx     Past Surgical History:  Procedure Laterality Date   ABDOMINAL HYSTERECTOMY  1993   endometriosis with pain   ABDOMINAL HYSTERECTOMY     ANKLE FRACTURE SURGERY Right    ANKLE SURGERY     CESAREAN SECTION     Neuro stimulator implant     orif lt wrist     TUBAL LIGATION  1997   TUBAL LIGATION     Social History   Occupational History   Occupation: unemployed  Tobacco Use   Smoking status: Some Days    Types: Cigarettes   Smokeless tobacco: Never   Tobacco comments:    uses e-sig 01/08/16  Vaping Use   Vaping status: Every Day  Substance and Sexual Activity   Alcohol use: Yes    Comment: Occas   Drug use: No   Sexual activity: Not Currently    Birth control/protection: Surgical    Comment: HYST

## 2023-10-19 ENCOUNTER — Other Ambulatory Visit: Payer: Self-pay

## 2023-10-19 ENCOUNTER — Ambulatory Visit (INDEPENDENT_AMBULATORY_CARE_PROVIDER_SITE_OTHER): Payer: Medicare HMO | Admitting: Physical Medicine and Rehabilitation

## 2023-10-19 DIAGNOSIS — M5412 Radiculopathy, cervical region: Secondary | ICD-10-CM | POA: Diagnosis not present

## 2023-10-19 MED ORDER — METHYLPREDNISOLONE ACETATE 40 MG/ML IJ SUSP
40.0000 mg | Freq: Once | INTRAMUSCULAR | Status: AC
Start: 2023-10-19 — End: 2023-10-19
  Administered 2023-10-19: 40 mg

## 2023-10-19 NOTE — Patient Instructions (Signed)

## 2023-10-19 NOTE — Procedures (Signed)
Cervical Epidural Steroid Injection - Interlaminar Approach with Fluoroscopic Guidance  Patient: [name]      Date of Birth: 10/16/1966 MRN: [id number] PCP: [facility]      Visit Date: 10/19/2023   Universal Protocol:    Date/Time: 12/02/241:02 PM  Consent Given By: the patient  Position: PRONE  Additional Comments: Vital signs were monitored before and after the procedure. Patient was prepped and draped in the usual sterile fashion. The correct patient, procedure, and site was verified.   Injection Procedure Details:   Procedure diagnoses: Cervical radiculopathy [M54.12]    Meds Administered:  Meds ordered this encounter  Medications   methylPREDNISolone acetate (DEPO-MEDROL) injection 40 mg     Laterality: Right  Location/Site: C7-T1  Needle: 3.5 in., 20 ga. Tuohy  Needle Placement: Paramedian epidural space  Findings:  -Comments: Excellent flow of contrast into the epidural space.  Procedure Details: Using a paramedian approach from the side mentioned above, the region overlying the inferior lamina was localized under fluoroscopic visualization and the soft tissues overlying this structure were infiltrated with 4 ml. of 1% Lidocaine without Epinephrine. A # 20 gauge, Tuohy needle was inserted into the epidural space using a paramedian approach.  The epidural space was localized using loss of resistance along with contralateral oblique bi-planar fluoroscopic views.  After negative aspirate for air, blood, and CSF, a 2 ml. volume of Isovue-250 was injected into the epidural space and the flow of contrast was observed. Radiographs were obtained for documentation purposes.   The injectate was administered into the level noted above.  Additional Comments:  The patient tolerated the procedure well Dressing: 2 x 2 sterile gauze and Band-Aid    Post-procedure details: Patient was observed during the procedure. Post-procedure  instructions were reviewed.  Patient left the clinic in stable condition.

## 2023-10-19 NOTE — Progress Notes (Signed)
[name] - 57 y.o. female MRN [id number]  Date of birth: 1966/05/16  Office Visit Note: Visit Date: 10/19/2023 PCP: [facility] Referred by: Roe Coombs*  Subjective: Chief Complaint  Patient presents with   Neck - Pain   HPI:  [name] is a 57 y.o. female who comes in today for planned repeat Right C7-T1  Cervical Interlaminar epidural steroid injection with fluoroscopic guidance.  The patient has failed conservative care including home exercise, medications, time and activity modification.  This injection will be diagnostic and hopefully therapeutic.  Please see requesting physician notes for further details and justification. Patient received more than 50% pain relief from prior injection.   Referring: Dr. Burnard Bunting and [facility], PA-C   ROS Otherwise per HPI.  Assessment & Plan: Visit Diagnoses:    ICD-10-CM   1. Cervical radiculopathy  M54.12 XR C-ARM NO REPORT    Epidural Steroid injection    methylPREDNISolone acetate (DEPO-MEDROL) injection 40 mg      Plan: No additional findings.   Meds & Orders:  Meds ordered this encounter  Medications   methylPREDNISolone acetate (DEPO-MEDROL) injection 40 mg    Orders Placed This Encounter  Procedures   XR C-ARM NO REPORT   Epidural Steroid injection    Follow-up: Return for visit to requesting provider as needed.   Procedures: No procedures performed  Cervical Epidural Steroid Injection - Interlaminar Approach with Fluoroscopic Guidance  Patient: [name]      Date of Birth: February 27, 1966 MRN: [id number] PCP: [facility]      Visit Date: 10/19/2023   Universal Protocol:    Date/Time: 12/02/241:02 PM  Consent Given By: the patient  Position: PRONE  Additional Comments: Vital signs were monitored before and after the procedure. Patient was prepped and draped in the usual sterile fashion. The correct  patient, procedure, and site was verified.   Injection Procedure Details:   Procedure diagnoses: Cervical radiculopathy [M54.12]    Meds Administered:  Meds ordered this encounter  Medications   methylPREDNISolone acetate (DEPO-MEDROL) injection 40 mg     Laterality: Right  Location/Site: C7-T1  Needle: 3.5 in., 20 ga. Tuohy  Needle Placement: Paramedian epidural space  Findings:  -Comments: Excellent flow of contrast into the epidural space.  Procedure Details: Using a paramedian approach from the side mentioned above, the region overlying the inferior lamina was localized under fluoroscopic visualization and the soft tissues overlying this structure were infiltrated with 4 ml. of 1% Lidocaine without Epinephrine. A # 20 gauge, Tuohy needle was inserted into the epidural space using a paramedian approach.  The epidural space was localized using loss of resistance along with contralateral oblique bi-planar fluoroscopic views.  After negative aspirate for air, blood, and CSF, a 2 ml. volume of Isovue-250 was injected into the epidural space and the flow of contrast was observed. Radiographs were obtained for documentation purposes.   The injectate was administered into the level noted above.  Additional Comments:  The patient tolerated the procedure well Dressing: 2 x 2 sterile gauze and Band-Aid    Post-procedure details: Patient was observed during the procedure. Post-procedure instructions were reviewed.  Patient left the clinic in stable condition.   Clinical History: CT CERVICAL SPINE WITHOUT CONTRAST   TECHNIQUE: Multidetector CT imaging of the cervical spine was performed without intravenous contrast. Multiplanar CT image reconstructions were also generated.   RADIATION DOSE REDUCTION: This exam was performed according to the departmental  dose-optimization program which includes automated exposure control, adjustment of the mA and/or kV according to patient  size and/or use of iterative reconstruction technique.   COMPARISON:  None Available.   FINDINGS: Alignment: Straightening of the cervical spine. No subluxation. Facet alignment is within normal limits   Skull base and vertebrae: No acute fracture. No primary bone lesion or focal pathologic process.   Soft tissues and spinal canal: No prevertebral fluid or swelling. No visible canal hematoma.   Disc levels: Moderate disc space narrowing C4-C5, C5-C6 and C6-C7. Facet degenerative changes at multiple levels. Mild bilateral foraminal narrowing C4 through C7.   Upper chest: Negative.   Other: None   IMPRESSION: Straightening of the cervical spine with degenerative changes. No acute osseous abnormality.     Electronically Signed   By: Jasmine Pang M.D.   On: 03/26/2023 23:27  ---  06/29/19 EMG/NCS IMPRESSION:   Nerve conduction studies done on both upper extremities shows evidence of borderline carpal tunnel syndrome bilaterally.  EMG evaluation of the right upper extremity was unremarkable, no evidence of an overlying cervical radiculopathy was seen.   Marlan Palau MD 06/29/2019 1:29 PM   [facility]     Objective:  VS:  HT:    WT:   BMI:     BP:   HR: bpm  TEMP: ( )  RESP:  Physical Exam Vitals and nursing note reviewed.  Constitutional:      General: She is not in acute distress.    Appearance: Normal appearance. She is not ill-appearing.  HENT:     Head: Normocephalic and atraumatic.     Right Ear: External ear normal.     Left Ear: External ear normal.  Eyes:     Extraocular Movements: Extraocular movements intact.  Cardiovascular:     Rate and Rhythm: Normal rate.     Pulses: Normal pulses.  Musculoskeletal:     Cervical back: Tenderness present. No rigidity.     Right lower leg: No edema.     Left lower leg: No edema.     Comments: Patient has good strength in the upper extremities including 5 out of 5 strength in wrist  extension long finger flexion and APB.  There is no atrophy of the hands intrinsically.  There is a negative Hoffmann's test.   Lymphadenopathy:     Cervical: No cervical adenopathy.  Skin:    Findings: No erythema, lesion or rash.  Neurological:     General: No focal deficit present.     Mental Status: She is alert and oriented to person, place, and time.     Sensory: No sensory deficit.     Motor: No weakness or abnormal muscle tone.     Coordination: Coordination normal.  Psychiatric:        Mood and Affect: Mood normal.        Behavior: Behavior normal.      Imaging: XR C-ARM NO REPORT  Result Date: 10/19/2023 Please see Notes tab for imaging impression.

## 2023-10-19 NOTE — Progress Notes (Signed)
Functional Pain Scale - descriptive words and definitions  Moderate (4)   Constantly aware of pain, can complete ADLs with modification/sleep marginally affected at times/passive distraction is of no use, but active distraction gives some relief. Moderate range order  Average Pain 4  131/82 R arm pain  +Driver, -BT, -Dye Allergies.

## 2023-12-18 ENCOUNTER — Emergency Department (HOSPITAL_BASED_OUTPATIENT_CLINIC_OR_DEPARTMENT_OTHER)
Admission: EM | Admit: 2023-12-18 | Discharge: 2023-12-18 | Disposition: A | Discharge status: Home / Self Care | Payer: Medicaid Other

## 2023-12-18 ENCOUNTER — Other Ambulatory Visit: Payer: Self-pay

## 2023-12-18 ENCOUNTER — Encounter (HOSPITAL_BASED_OUTPATIENT_CLINIC_OR_DEPARTMENT_OTHER): Payer: Self-pay | Admitting: Emergency Medicine

## 2023-12-18 ENCOUNTER — Emergency Department (HOSPITAL_BASED_OUTPATIENT_CLINIC_OR_DEPARTMENT_OTHER): Payer: Medicaid Other

## 2023-12-18 DIAGNOSIS — S9031XA Contusion of right foot, initial encounter: Secondary | ICD-10-CM | POA: Diagnosis not present

## 2023-12-18 DIAGNOSIS — I10 Essential (primary) hypertension: Secondary | ICD-10-CM | POA: Insufficient documentation

## 2023-12-18 DIAGNOSIS — X58XXXA Exposure to other specified factors, initial encounter: Secondary | ICD-10-CM | POA: Insufficient documentation

## 2023-12-18 DIAGNOSIS — S99921A Unspecified injury of right foot, initial encounter: Secondary | ICD-10-CM | POA: Diagnosis present

## 2023-12-18 MED ORDER — NAPROXEN 500 MG PO TABS: 500.0000 mg | ORAL_TABLET | Freq: Two times a day (BID) | ORAL | 0 refills | Status: DC

## 2023-12-18 NOTE — Discharge Instructions (Signed)
Your x-ray did not show any acute findings.  Please keep an eye on the area.  Keep your foot elevated is much as possible to help with swelling.  Please take the naproxen as needed for pain and swelling.  If you notice any fevers, open sores, worsening bruising or other concerning symptoms please return to the ER for reevaluation.

## 2023-12-18 NOTE — ED Triage Notes (Signed)
Pt [name], NAD stating she noticed redness in her R toe and woke up this morning with bruising to the toe and foot. Pt denies any obvious injury to the foot, but does state she had a long drive from [place] earlier this week. Denies PMH of DVT or PE.

## 2023-12-18 NOTE — ED Provider Notes (Signed)
[place] EMERGENCY DEPARTMENT AT [facility] Provider Note   CSN: [id number] Arrival date & time: 12/18/23  1112     History  No chief complaint on file.   [name] is a 58 y.o. female.  58 year old female with past medical history of anxiety, hypertension, and hyperlipidemia presenting to the emergency department today with pain and bruising in the second toe on her right foot.  The patient states that she was recently down in [place] helping move her dad out of his house who had dementia.  She states that she may have injured herself not knowingly during that time.  She states that she noticed some stinging pain in her second toe yesterday.  She states when she woke up this morning that she noted some bruising as well as some bruising over her foot.  She came to the ER at that time for further evaluation.  She denies any fevers or chills.        Home Medications Prior to Admission medications   Medication Sig Start Date End Date Taking? Authorizing Provider  naproxen (NAPROSYN) 500 MG tablet Take 1 tablet (500 mg total) by mouth 2 (two) times daily. 12/18/23  Yes Durwin Glaze, MD  ALPRAZolam Prudy Feeler) 0.5 MG tablet Take 0.5 mg by mouth at bedtime as needed for anxiety.    [provider]  buPROPion (WELLBUTRIN SR) 150 MG 12 hr tablet Take 1 tablet by mouth 2 (two) times daily. 12/09/22   [provider]  diazepam (VALIUM) 5 MG tablet Take 1 by mouth 1 hour  pre-procedure with very light food. May bring 2nd tablet to appointment. 06/12/23   Tyrell Antonio, MD  DULoxetine (CYMBALTA) 30 MG capsule Take by mouth. 07/23/22   [provider]  FLUoxetine (PROZAC) 40 MG capsule Take 40 mg by mouth daily.    [provider]  furosemide (LASIX) 40 MG tablet Take 40 mg by mouth daily. Reported on 11/12/2015 03/20/15   [provider]  ibuprofen (ADVIL) 800 MG tablet Take 1 tablet (800 mg total) by mouth every 8 (eight) hours as  needed. Take with food to prevent GI upset 03/24/23   Valentino Nose, NP  Multiple Vitamin (MULTIVITAMIN) tablet Take 1 tablet by mouth daily.    [provider]  omeprazole (PRILOSEC) 20 MG capsule Take 20 mg by mouth daily.    [provider]  potassium chloride (MICRO-K) 10 MEQ CR capsule Take 10 mEq by mouth daily. 03/20/15   [provider]  rosuvastatin (CRESTOR) 20 MG tablet Take 20 mg by mouth at bedtime.    [provider]  zolpidem (AMBIEN) 10 MG tablet Take 10 mg by mouth at bedtime as needed for sleep.    [provider]      Allergies    Lisinopril    Review of Systems   Review of Systems  Musculoskeletal:        Right foot pain  All other systems reviewed and are negative.   Physical Exam Updated Vital Signs BP (!) 167/72   Pulse 69   Temp 98.2 F (36.8 C) (Oral)   Resp 18   Ht 5\' 7"  (1.702 m)   Wt 90.7 kg   SpO2 98%   BMI 31.32 kg/m  Physical Exam Vitals and nursing note reviewed.  Constitutional:      Appearance: Normal appearance.  Cardiovascular:     Rate and Rhythm: Normal rate and regular rhythm.     Pulses: Normal  pulses.  Musculoskeletal:     Comments: There are some ecchymosis noted over the second toe on the right foot as well as some mild ecchymosis noted over the distal metatarsals on the right.  No open wounds.  Compartments are soft.  The patient has strong DP and PT pulses.  Normal capillary refill is noted.  Neurological:     Mental Status: She is alert.     ED Results / Procedures / Treatments   Labs (all labs ordered are listed, but only abnormal results are displayed) Labs Reviewed - No data to display  EKG None  Radiology DG Foot Complete Right Result Date: 12/18/2023 CLINICAL DATA:  Right foot bruising and pain without known injury EXAM: RIGHT FOOT COMPLETE - 3+ VIEW COMPARISON:  None Available. FINDINGS: No fracture or dislocation. Lisfranc joint appears intact. Tiny Achilles and  plantar right calcaneal spurs. No significant arthropathy. Partially visualized healed deformity with mixed lucent and sclerotic change in the distal right tibia, similar to the 07/27/2014 right ankle radiographs. Remnant screw fragment in the distal right tibial metaphysis. IMPRESSION: 1. No acute osseous abnormality in the right foot. 2. Tiny Achilles and plantar right calcaneal spurs. 3. Partially visualized healed deformity with mixed lucent and sclerotic change in the distal right tibia, similar to the 07/27/2014 right ankle radiographs. Electronically Signed   By: Delbert Phenix M.D.   On: 12/18/2023 12:21    Procedures Procedures    Medications Ordered in ED Medications - No data to display  ED Course/ Medical Decision Making/ A&P                                 Medical Decision Making 58 year old female with past medical history of hypertension and hyperlipidemia presenting to the emergency department today with ecchymosis noted over her right foot.  No obtain x-ray here to evaluate for bony injuries.  Does not appear to be infectious.  The patient does have strong peripheral pulses that this does not seem consistent with arterial occlusion.  She does not have any calf tenderness to suggest DVT at this time.  Denies any chest pain or shortness of breath consistent with PE.  Think that if her x-ray is unremarkable that she may be discharged with outpatient follow-up with return precautions.  The patient's x-ray here is unremarkable.  She is discharged with return precautions.  Amount and/or Complexity of Data Reviewed Radiology: ordered.  Risk Prescription drug management.           Final Clinical Impression(s) / ED Diagnoses Final diagnoses:  Contusion of right foot, initial encounter    Rx / DC Orders ED Discharge Orders          Ordered    naproxen (NAPROSYN) 500 MG tablet  2 times daily        12/18/23 1259              Durwin Glaze, MD 12/18/23  1300

## 2023-12-29 ENCOUNTER — Ambulatory Visit (HOSPITAL_COMMUNITY): Payer: Medicare HMO

## 2023-12-29 ENCOUNTER — Inpatient Hospital Stay (HOSPITAL_COMMUNITY): Admission: RE | Admit: 2023-12-29 | Payer: Medicare HMO | Source: Ambulatory Visit

## 2024-01-11 ENCOUNTER — Ambulatory Visit: Payer: Medicare HMO | Admitting: Orthopedic Surgery

## 2024-02-03 ENCOUNTER — Ambulatory Visit (HOSPITAL_COMMUNITY)
Admission: RE | Admit: 2024-02-03 | Discharge: 2024-02-03 | Disposition: A | Discharge status: Home / Self Care | Source: Ambulatory Visit | Attending: Surgical

## 2024-02-03 ENCOUNTER — Ambulatory Visit (HOSPITAL_COMMUNITY)
Admission: RE | Admit: 2024-02-03 | Discharge: 2024-02-03 | Disposition: A | Discharge status: Home / Self Care | Source: Ambulatory Visit | Attending: Surgical | Admitting: Surgical

## 2024-02-03 DIAGNOSIS — S43431A Superior glenoid labrum lesion of right shoulder, initial encounter: Secondary | ICD-10-CM | POA: Diagnosis present

## 2024-02-03 DIAGNOSIS — X58XXXA Exposure to other specified factors, initial encounter: Secondary | ICD-10-CM | POA: Insufficient documentation

## 2024-02-03 DIAGNOSIS — M25511 Pain in right shoulder: Secondary | ICD-10-CM | POA: Diagnosis present

## 2024-02-03 MED ORDER — IOHEXOL 180 MG/ML  SOLN
10.0000 mL | Freq: Once | INTRAMUSCULAR | Status: AC | PRN
  Administered 2024-02-03: 10 mL via INTRA_ARTICULAR

## 2024-02-03 MED ORDER — SODIUM CHLORIDE (PF) 0.9% IJ SOLUTION - NO CHARGE
10.0000 mL | Freq: Once | INTRAMUSCULAR | Status: AC
  Administered 2024-02-03: 10 mL
  Filled 2024-02-03: qty 10

## 2024-02-03 MED ORDER — LIDOCAINE HCL (PF) 1 % IJ SOLN
5.0000 mL | Freq: Once | INTRAMUSCULAR | Status: AC
  Administered 2024-02-03: 10 mL via INTRADERMAL

## 2024-02-03 NOTE — Procedures (Signed)
 Interventional Radiology Procedure Note  Risks and benefits of joint injection were discussed with the patient including, but not limited to bleeding, infection, injection of contrast outside the joint, and damage to adjacent structures.  All of the patient's questions were answered, patient is agreeable to proceed. Consent signed and in chart.  A timeout was performed with all members of the team prior to start of the procedure. Correct patient and correct procedure was confirmed. Allergies were reviewed.   PROCEDURE SUMMARY:  Successful fluoro guided right shoulder arthrogram for CT. No immediate complications.  Pt tolerated well.   EBL = none  Please see full dictation in imaging section of Epic for procedure details.

## 2024-02-15 ENCOUNTER — Ambulatory Visit (INDEPENDENT_AMBULATORY_CARE_PROVIDER_SITE_OTHER): Admitting: Orthopedic Surgery

## 2024-02-15 DIAGNOSIS — S43431A Superior glenoid labrum lesion of right shoulder, initial encounter: Secondary | ICD-10-CM | POA: Diagnosis not present

## 2024-02-15 DIAGNOSIS — M5412 Radiculopathy, cervical region: Secondary | ICD-10-CM

## 2024-02-15 MED ORDER — TRAMADOL HCL 50 MG PO TABS: 50.0000 mg | ORAL_TABLET | Freq: Two times a day (BID) | ORAL | 0 refills | Status: DC | PRN

## 2024-02-15 NOTE — Progress Notes (Unsigned)
 Office Visit Note   Patient: [name]           Date of Birth: 08/12/1966           MRN: [id number] Visit Date: 02/15/2024 Requested by: [facility] [address] [place],  [place] [address] PCP: [facility]  Subjective: Chief Complaint  Patient presents with   Other    Review scan    HPI: [name] is a 58 y.o. female who presents to the office reporting right shoulder pain.  Since she was last seen she has had a CT arthrogram..  On my review patient does have a nonretracted 95% thickness tear of the supraspinatus with likely punctate area allowing fluid to extravasate into the bursal cavity.  Not too much in terms of arthritis.  Biceps anchor appears intact.  Patient has had 2 injections.  Has had 2 days of relief from those injections but then the pain recurs.  She is able to put her hand behind her back.  She cannot sleep on the right-hand side.  Pain is a bigger problem than weakness in the right shoulder.  Used to be that her symptoms were better with the arm overhead but not currently.  Hard for her to work.  Father is in rehab.  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  Denies any scapular pain.  Does have episodic hand numbness and tingling.  Symptoms have been ongoing now for 4 to 5 years.  Denies any cardiac history.              ROS: All systems reviewed are negative as they relate to the chief complaint within the history of present illness.  Patient denies fevers or chills.  Assessment & Plan: Visit Diagnoses:  1. Cervical radiculopathy   2. Superior labrum anterior-to-posterior (SLAP) tear of right shoulder     Plan: Impression is right shoulder rotator cuff tear which has at least punctate full-thickness component.  The tear itself looks relatively nonretracted and there is no atrophy visible in the muscle belly.  Plan at this time is arthroscopy with evaluation  of the biceps tendon with possible tenodesis and mini open rotator cuff tear repair.  Patient is a smoker.  That will inhibit some of the healing that is required for this procedure to be successful.  Risk and benefits including but limited to infection or vessel damage incomplete pain relief as well as incomplete restoration of function are all discussed.  The rigorous nature of the rehabilitative process was also discussed.  All questions answered.  Follow-Up Instructions: No follow-ups on file.   Orders:  No orders of the defined types were placed in this encounter.  Meds ordered this encounter  Medications   traMADol (ULTRAM) 50 MG tablet    Sig: Take 1 tablet (50 mg total) by mouth every 12 (twelve) hours as needed.    Dispense:  20 tablet    Refill:  0      Procedures: No procedures performed   Clinical Data: No additional findings.  Objective: Vital Signs: There were no vitals taken for this visit.  Physical Exam:  Constitutional: Patient appears well-developed HEENT:  Head: Normocephalic Eyes:EOM are normal Neck: Normal range of motion Cardiovascular: Normal rate Pulmonary/chest: Effort normal Neurologic: Patient is alert Skin: Skin is warm Psychiatric: Patient has normal mood and affect  Ortho Exam: Ortho exam demonstrates pretty good external rotation strength  bilaterally 5 out of 5.  Subscap strength 5+ out of 5.  Mild slight crepitus with internal/external Tatian on the right at 90 degrees of abduction.  No discrete AC joint tenderness right versus left.  No pain with crossarm adduction on the right versus left.  Does have bicipital groove tenderness and positive O'Brien's testing on the right.  Deltoid is functional..  No masses lymphadenopathy or skin changes present in that shoulder girdle region.  Specialty Comments:  CT CERVICAL SPINE WITHOUT CONTRAST   TECHNIQUE: Multidetector CT imaging of the cervical spine was performed without intravenous contrast.  Multiplanar CT image reconstructions were also generated.   RADIATION DOSE REDUCTION: This exam was performed according to the departmental dose-optimization program which includes automated exposure control, adjustment of the mA and/or kV according to patient size and/or use of iterative reconstruction technique.   COMPARISON:  None Available.   FINDINGS: Alignment: Straightening of the cervical spine. No subluxation. Facet alignment is within normal limits   Skull base and vertebrae: No acute fracture. No primary bone lesion or focal pathologic process.   Soft tissues and spinal canal: No prevertebral fluid or swelling. No visible canal hematoma.   Disc levels: Moderate disc space narrowing C4-C5, C5-C6 and C6-C7. Facet degenerative changes at multiple levels. Mild bilateral foraminal narrowing C4 through C7.   Upper chest: Negative.   Other: None   IMPRESSION: Straightening of the cervical spine with degenerative changes. No acute osseous abnormality.     Electronically Signed   By: Jasmine Pang M.D.   On: 03/26/2023 23:27  ---  06/29/19 EMG/NCS IMPRESSION:   Nerve conduction studies done on both upper extremities shows evidence of borderline carpal tunnel syndrome bilaterally.  EMG evaluation of the right upper extremity was unremarkable, no evidence of an overlying cervical radiculopathy was seen.   Marlan Palau MD 06/29/2019 1:29 PM   [facility]  Imaging: No results found.   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   Heartburn 01/01/2016   Hypertension 01/01/2016   Obesity 01/01/2016   Osteoarthritis 01/01/2016   Peripheral neuropathy 01/01/2016   Snoring 01/01/2016   Sleep disturbance 07/18/2015   Arthritis of ankle 09/07/2014   Ankle fracture, right 07/29/2014   Chronic pain syndrome 08/01/2013   Past Medical History:  Diagnosis Date   Anxiety    Arthritis    ASCUS favor benign 09/2014, 03/2015   negative high risk HPV 09/2014   Broken ankle    Depression    Enlarged thyroid    GERD (gastroesophageal reflux disease)    Heart murmur    HTN (hypertension)     Family History  Problem Relation Age of Onset   Cancer Father        Brain tumor   Other Father        Brain tumor   Heart disease Father    Healthy Mother    Breast cancer Neg Hx     Past Surgical History:  Procedure Laterality Date   ABDOMINAL HYSTERECTOMY  1993   endometriosis with pain  ABDOMINAL HYSTERECTOMY     ANKLE FRACTURE SURGERY Right    ANKLE SURGERY     CESAREAN SECTION     Neuro stimulator implant     orif lt wrist     TUBAL LIGATION  1997   TUBAL LIGATION     Social History   Occupational History   Occupation: unemployed  Tobacco Use   Smoking status: Some Days    Types: Cigarettes   Smokeless tobacco: Never   Tobacco comments:    uses e-sig 01/08/16  Vaping Use   Vaping status: Every Day  Substance and Sexual Activity   Alcohol use: Yes    Comment: Occas   Drug use: No   Sexual activity: Not Currently    Birth control/protection: Surgical    Comment: HYST

## 2024-02-16 ENCOUNTER — Encounter: Payer: Self-pay | Admitting: Orthopedic Surgery

## 2024-02-22 NOTE — Progress Notes (Signed)
 Surgical Instructions    Your procedure is scheduled on April 15.  Report to [facility] Main Entrance "A" at 5:30  A.M., then check in with the Admitting office.  Call this number if you have problems the morning of surgery:  [phone]   If you have any questions prior to your surgery date call [phone]: Open Monday-Friday 8am-4pm If you experience any cold or flu symptoms such as cough, fever, chills, shortness of breath, etc. between now and your scheduled surgery, please notify us at the above number     Remember:  Do not eat after midnight the night before your surgery  You may drink clear liquids until 4:30am the morning of your surgery.   Clear liquids allowed are: Water, Non-Citrus Juices (without pulp), Carbonated Beverages, Clear Tea, Black Coffee ONLY (NO MILK, CREAM OR POWDERED CREAMER of any kind), and Gatorade    Take these medicines the morning of surgery with A SIP OF WATER:  buPROPion (WELLBUTRIN SR)  DULoxetine (CYMBALTA)  omeprazole (PRILOSEC)  FLUoxetine (PROZAC)   Take these medicines if needed: traMADol (ULTRAM)   As of today, STOP taking any Aspirin (unless otherwise instructed by your surgeon) Aleve, Naproxen, Ibuprofen, Motrin, Advil, Goody's, BC's, all herbal medications, fish oil, and all vitamins.           Do not wear jewelry or makeup. Do not wear lotions, powders, perfumes/cologne or deodorant. Do not shave 48 hours prior to surgery.  Men may shave face and neck. Do not bring valuables to the hospital. Do not wear nail polish, gel polish, artificial nails, or any other type of covering on natural nails (fingers and toes) If you have artificial nails or gel coating that need to be removed by a nail salon, please have this removed prior to surgery. Artificial nails or gel coating may interfere with anesthesia's ability to adequately monitor your vital signs.  [facility] is not responsible for any belongings or valuables.    Do NOT Smoke  (Tobacco/Vaping)  24 hours prior to your procedure  If you use a CPAP at night, you may bring your mask for your overnight stay.   Contacts, glasses, hearing aids, dentures or partials may not be worn into surgery, please bring cases for these belongings   For patients admitted to the hospital, discharge time will be determined by your treatment team.   Patients discharged the day of surgery will not be allowed to drive home, and someone needs to stay with them for 24 hours.   SURGICAL WAITING ROOM VISITATION Patients having surgery or a procedure may have no more than 2 support people in the waiting area - these visitors may rotate.   Children under the age of 8 must have an adult with them who is not the patient. If the patient needs to stay at the hospital during part of their recovery, the visitor guidelines for inpatient rooms apply. Pre-op nurse will coordinate an appropriate time for 1 support person to accompany patient in pre-op.  This support person may not rotate.   Please refer to [email or web address] for the visitor guidelines for Inpatients (after your surgery is over and you are in a regular room).    Special instructions:                                        [facility]- Preparing for Total Shoulder Arthroplasty   Before  surgery, you can play an important role. Because skin is not sterile, your skin needs to be as free of germs as possible. You can reduce the number of germs on your skin by using the following products. Benzoyl Peroxide Gel Reduces the number of germs present on the skin Applied twice a day to shoulder area starting two days before surgery   Chlorhexidine Gluconate (CHG) Soap An antiseptic cleaner that kills germs and bonds with the skin to continue killing germs even after washing Used for showering the night before surgery and morning of surgery   Oral Hygiene is also important to reduce your  risk of infection.                                    Remember - BRUSH YOUR TEETH THE MORNING OF SURGERY WITH YOUR REGULAR TOOTHPASTE  ==================================================================  Please follow these instructions carefully:  BENZOYL PEROXIDE 5% GEL  Please do not use if you have an allergy to benzoyl peroxide.   If your skin becomes reddened/irritated stop using the benzoyl peroxide.  Starting two days before surgery, apply as follows: Apply benzoyl peroxide in the morning and at night. Apply after taking a shower. If you are not taking a shower clean entire shoulder front, back, and side along with the armpit with a clean wet washcloth.  Place a quarter-sized dollop on your shoulder and rub in thoroughly, making sure to cover the front, back, and side of your shoulder, along with the armpit.   2 days before ____ AM   ____ PM              1 day before ____ AM   ____ PM                             Do this twice a day for two days.  (Last application is the night before surgery, AFTER using the CHG soap as described below).  Do NOT apply benzoyl peroxide gel on the day of surgery.  CHLORHEXIDINE GLUCONATE (CHG) SOAP  Please do not use if you have an allergy to CHG or antibacterial soaps. If your skin becomes reddened/irritated stop using the CHG.   Do not shave (including legs and underarms) for at least 48 hours prior to first CHG shower. It is OK to shave your face.  Starting the night before surgery, use CHG soap as follows:  Shower the NIGHT BEFORE SURGERY and MORNING OF SURGERY with CHG.  If you choose to wash your hair, wash your hair first as usual with your normal shampoo.  After shampooing, rinse your hair and body thoroughly to remove the shampoo.  Use CHG as you would any other liquid soap.  You can apply CHG directly to the skin and wash gently with a scrungie or a clean washcloth.  Apply the CHG soap to your body ONLY FROM THE NECK DOWN.  Do  not use on open wounds or open sores.  Avoid contact with your eyes, ears, mouth, and genitals (private parts).  Wash face and genitals (private parts) with your normal soap.  Wash thoroughly, paying special attention to the area where your surgery will be performed.  Thoroughly rinse your body with warm water from the neck down.  DO NOT shower/wash with your normal soap after using and rinsing off the CHG soap.   Pat yourself  dry with a CLEAN TOWEL.    Apply benzoyl peroxide.   Wear CLEAN PAJAMAS to bed the night before surgery; wear comfortable clothes the morning of surgery.  Place CLEAN SHEETS on your bed the night of your first shower and DO NOT SLEEP WITH PETS.  Day of Surgery: Shower as above Do not apply any deodorants/lotions.  Please wear clean clothes to the hospital/surgery center.   Remember to brush your teeth WITH YOUR REGULAR TOOTHPASTE.     If you received a COVID test during your pre-op visit, it is requested that you wear a mask when out in public, stay away from anyone that may not be feeling well, and notify your surgeon if you develop symptoms. If you have been in contact with anyone that has tested positive in the last 10 days, please notify your surgeon.    Please read over the following fact sheets that you were given.

## 2024-02-23 ENCOUNTER — Encounter (HOSPITAL_COMMUNITY): Payer: Self-pay

## 2024-02-23 ENCOUNTER — Encounter (HOSPITAL_COMMUNITY)
Admission: RE | Admit: 2024-02-23 | Discharge: 2024-02-23 | Disposition: A | Discharge status: Home / Self Care | Source: Ambulatory Visit | Attending: Orthopedic Surgery | Admitting: Orthopedic Surgery

## 2024-02-23 ENCOUNTER — Other Ambulatory Visit: Payer: Self-pay

## 2024-02-23 VITALS — BP 119/77 | HR 84 | Temp 97.9°F | Resp 17 | Ht 67.0 in | Wt 215.0 lb

## 2024-02-23 DIAGNOSIS — E785 Hyperlipidemia, unspecified: Secondary | ICD-10-CM | POA: Insufficient documentation

## 2024-02-23 DIAGNOSIS — M75111 Incomplete rotator cuff tear or rupture of right shoulder, not specified as traumatic: Secondary | ICD-10-CM | POA: Diagnosis not present

## 2024-02-23 DIAGNOSIS — M7521 Bicipital tendinitis, right shoulder: Secondary | ICD-10-CM | POA: Diagnosis not present

## 2024-02-23 DIAGNOSIS — Z01818 Encounter for other preprocedural examination: Secondary | ICD-10-CM | POA: Insufficient documentation

## 2024-02-23 DIAGNOSIS — I10 Essential (primary) hypertension: Secondary | ICD-10-CM | POA: Insufficient documentation

## 2024-02-23 DIAGNOSIS — Z87891 Personal history of nicotine dependence: Secondary | ICD-10-CM | POA: Insufficient documentation

## 2024-02-23 NOTE — Progress Notes (Signed)
 PCP - Pearletha Alfred, PA Cardiologist - denies  PPM/ICD - denies Device Orders - n/a Rep Notified - n/a  Chest x-ray - denies EKG - 02/23/2024 Stress Test - denies ECHO - denies Cardiac Cath - denies  Sleep Study - yes; in 2016 CPAP - denies  Fasting Blood Sugar - no DM Checks Blood Sugar _____ times a day  Last dose of GLP1 agonist-  n/a GLP1 instructions: n/a  Blood Thinner Instructions: n/a Aspirin Instructions: n/a  ERAS Protcol - yes; till 0430 am PRE-SURGERY Ensure or G2- Ensure  COVID TEST- n/a   Anesthesia review: yes, hx of heart murmur. Revonda Standard, PA is assessing the pt.   Patient denies shortness of breath, fever, cough and chest pain at PAT appointment   All instructions explained to the patient, with a verbal understanding of the material. Patient agrees to go over the instructions while at home for a better understanding. Patient also instructed to self quarantine after being tested for COVID-19. The opportunity to ask questions was provided.

## 2024-02-23 NOTE — Progress Notes (Signed)
 Anesthesia APP PAT Evaluation:  Case: [id number] Date/Time: 03/01/24 0715   Procedures:      ARTHROSCOPY, SHOULDER WITH REPAIR, ROTATOR CUFF, OPEN (Right) - RIGHT SHOULDER ARTHROSCOPY, DEBRIDEMENT, BICEPS TENODESIS, MINI OPEN ROTATOR CUFF TEAR REPAIR     TENODESIS, BICEPS (Right)   Anesthesia type: General   Diagnosis: Nontraumatic tear of right rotator cuff, unspecified tear extent [M75.101]   Pre-op diagnosis: right shoulder rotator cuff tear, bicep tendinitis   [facility]: [facility] ROOM 04 / [facility]   Surgeons: Cammy Copa, MD       DISCUSSION: Patient is a 58 year old female scheduled for the above procedure.  History includes smoking, HTN, GERD, murmur, right ankle fracture (s/p ORIF 12/2004; removal of hardware 08/2008), hysterectomy.   Evaluated at PAT due to history of murmur. Reported diagnosed as an infant, but has been told at least intermittently as an adult. An echo has never been recommended. She ran track as a teenager and had no activity restrictions. She denied chest pain, SOB, edema, syncope.  Activity is limited for several years now following right ankle injury requiring multiple surgeries. She is able to do ADL and house cleaning without CV symptoms. I did not hear a murmur on exam. No ankle edema noted. No carotid bruits noted. Lungs clear.  Last PCP visit with Chilton Greathouse, PA-C was on 02/15/24. Valsartan increased for better HTN control. Continue rosuvastatin for HLD. Dependent edema, stable and managed on Lasix. Discussed [id number], although not currently taking by medication list.     VS: BP 119/77   Pulse 84   Temp 36.6 C   Resp 17   Ht 5\' 7"  (1.702 m)   Wt 97.5 kg   SpO2 99%   BMI 33.67 kg/m   PROVIDERS: Chilton Greathouse, PA-C is PCP  [facility]   LABS: {CHL AN LABS REVIEWED:112001::"Labs reviewed: Acceptable for surgery."} (all labs ordered are listed, but only abnormal results are displayed)  Labs Reviewed - No data to  display   Sleep Study 07/03/15: IMPRESSIONS No significant obstructive sleep apnea occurred during this study (AHI = 1.9/hour). No significant central sleep apnea occurred during this study (CAI = 0.0). Mild oxygen desaturation was noted during this study (Min O2 = 87.00). The patient snored with Moderate snoring volume during the diagnostic portion of the study. No cardiac abnormalities were noted during this study. Clinically significant periodic limb movements did not occur during sleep.    IMAGES: CT right shoulder 02/03/24: IMPRESSION: 1. Tendinosis and high-grade partial-thickness bursal surface tear of the supraspinatus tendon measuring 9.6 mm medial-lateral and 11 mm AP with a small full-thickness component. 2. Small partial thickness articular surface tear of the anterior infraspinatus tendon.    EKG: Cannot rule out Anterior infarct , age undetermined Abnormal ECG When compared with ECG of 21-Mar-2015 07:10, PREVIOUS ECG IS PRESENT Low atrial rhythm Confirmed by Nicki Guadalajara ([id number]) on 02/23/2024 4:44:59 PM - Inverted p waves/ectopic atrial rhythm also noted on 11/12/15 & 03/21/15 tracings.   CV: N/A  Past Medical History:  Diagnosis Date   Anxiety    Arthritis    ASCUS favor benign 09/2014, 03/2015   negative high risk HPV 09/2014   Broken ankle    Depression    Enlarged thyroid    GERD (gastroesophageal reflux disease)    Heart murmur    HTN (hypertension)     Past Surgical History:  Procedure Laterality Date   ABDOMINAL HYSTERECTOMY  1993   endometriosis with  pain   ABDOMINAL HYSTERECTOMY     ANKLE FRACTURE SURGERY Right    ANKLE SURGERY     CESAREAN SECTION     Neuro stimulator implant     orif lt wrist     TUBAL LIGATION  1997   TUBAL LIGATION      MEDICATIONS:  ALPRAZolam (XANAX) 0.5 MG tablet   augmented betamethasone dipropionate (DIPROLENE-AF) 0.05 % cream   buPROPion (WELLBUTRIN SR) 150 MG 12 hr tablet   DULoxetine (CYMBALTA) 30 MG  capsule   furosemide (LASIX) 40 MG tablet   naproxen (NAPROSYN) 500 MG tablet   omeprazole (PRILOSEC) 20 MG capsule   rosuvastatin (CRESTOR) 20 MG tablet   traMADol (ULTRAM) 50 MG tablet   valsartan (DIOVAN) 160 MG tablet   valsartan (DIOVAN) 80 MG tablet   zolpidem (AMBIEN) 10 MG tablet   No current facility-administered medications for this encounter.

## 2024-02-23 NOTE — Pre-Procedure Instructions (Addendum)
 Surgical Instructions                 Your procedure is scheduled on April 15.              Report to [facility] Main Entrance "A" at 5:30  A.M., then check in with the Admitting office.             Call this number if you have problems the morning of surgery:             [phone]    If you have any questions prior to your surgery date call [phone]: Open Monday-Friday 8am-4pm If you experience any cold or flu symptoms such as cough, fever, chills, shortness of breath, etc. between now and your scheduled surgery, please notify us at the above number                  Remember:             Do not eat after midnight the night before your surgery   You may drink clear liquids until 4:30am the morning of your surgery.   Clear liquids allowed are: Water, Non-Citrus Juices (without pulp), Carbonated Beverages, Clear Tea, Black Coffee ONLY (NO MILK, CREAM OR POWDERED CREAMER of any kind), and Gatorade  Patient Instructions  The night before surgery:  No food after midnight. ONLY clear liquids after midnight  The day of surgery (if you do NOT have diabetes):  Drink ONE (1) Pre-Surgery Clear Ensure by 0430 the morning of surgery. Drink in one sitting. Do not sip.  This drink was given to you during your hospital  pre-op appointment visit.  Nothing else to drink after completing the  Pre-Surgery Clear Ensure.          If you have questions, please contact your surgeon's office.                           Take these medicines the morning of surgery with A SIP OF WATER:  buPROPion (WELLBUTRIN SR)  DULoxetine (CYMBALTA)  omeprazole (PRILOSEC)  rosuvastatin (CRESTOR)    Take these medicines if needed: ALPRAZolam Prudy Feeler)  traMADol (ULTRAM)    As of today, STOP taking any Aspirin (unless otherwise instructed by your surgeon) Aleve, Naproxen, Ibuprofen, Motrin, Advil, Goody's, BC's, all herbal medications, fish oil, and all vitamins.            Do not wear jewelry or makeup. Do  not wear lotions, powders, perfumes/cologne or deodorant. Do not shave 48 hours prior to surgery.  Men may shave face and neck. Do not bring valuables to the hospital. Do not wear nail polish, gel polish, artificial nails, or any other type of covering on natural nails (fingers and toes) If you have artificial nails or gel coating that need to be removed by a nail salon, please have this removed prior to surgery. Artificial nails or gel coating may interfere with anesthesia's ability to adequately monitor your vital signs.   [facility] is not responsible for any belongings or valuables.     Do NOT Smoke (Tobacco/Vaping)  24 hours prior to your procedure   If you use a CPAP at night, you may bring your mask for your overnight stay.   Contacts, glasses, hearing aids, dentures or partials may not be worn into surgery, please bring cases for these belongings   For patients admitted to the hospital, discharge time will be determined by  your treatment team.   Patients discharged the day of surgery will not be allowed to drive home, and someone needs to stay with them for 24 hours.     SURGICAL WAITING ROOM VISITATION Patients having surgery or a procedure may have no more than 2 support people in the waiting area - these visitors may rotate.   Children under the age of 40 must have an adult with them who is not the patient. If the patient needs to stay at the hospital during part of their recovery, the visitor guidelines for inpatient rooms apply. Pre-op nurse will coordinate an appropriate time for 1 support person to accompany patient in pre-op.  This support person may not rotate.    Please refer to [email or web address] for the visitor guidelines for Inpatients (after your surgery is over and you are in a regular room).      Special instructions:                                            [facility]- Preparing for Total Shoulder  Arthroplasty    Before surgery, you can play an important role. Because skin is not sterile, your skin needs to be as free of germs as possible. You can reduce the number of germs on your skin by using the following products. Benzoyl Peroxide Gel Reduces the number of germs present on the skin Applied twice a day to shoulder area starting two days before surgery   Chlorhexidine Gluconate (CHG) Soap An antiseptic cleaner that kills germs and bonds with the skin to continue killing germs even after washing Used for showering the night before surgery and morning of surgery   Oral Hygiene is also important to reduce your risk of infection.                                    Remember - BRUSH YOUR TEETH THE MORNING OF SURGERY WITH YOUR REGULAR TOOTHPASTE   ==================================================================   Please follow these instructions carefully:   BENZOYL PEROXIDE 5% GEL   Please do not use if you have an allergy to benzoyl peroxide.   If your skin becomes reddened/irritated stop using the benzoyl peroxide.   Starting two days before surgery, apply as follows: Apply benzoyl peroxide in the morning and at night. Apply after taking a shower. If you are not taking a shower clean entire shoulder front, back, and side along with the armpit with a clean wet washcloth.   Place a quarter-sized dollop on your shoulder and rub in thoroughly, making sure to cover the front, back, and side of your shoulder, along with the armpit.    2 days before ____ AM   ____ PM              1 day before ____ AM   ____ PM                                Do this twice a day for two days.  (Last application is the night before surgery, AFTER using the CHG soap as described below).   Do NOT apply benzoyl peroxide gel on the day of surgery.   CHLORHEXIDINE GLUCONATE (CHG) SOAP   Please do not use if  you have an allergy to CHG or antibacterial soaps. If your skin becomes reddened/irritated stop  using the CHG.    Do not shave (including legs and underarms) for at least 48 hours prior to first CHG shower. It is OK to shave your face.   Starting the night before surgery, use CHG soap as follows:   Shower the NIGHT BEFORE SURGERY and MORNING OF SURGERY with CHG.   If you choose to wash your hair, wash your hair first as usual with your normal shampoo.   After shampooing, rinse your hair and body thoroughly to remove the shampoo.   Use CHG as you would any other liquid soap.  You can apply CHG directly to the skin and wash gently with a scrungie or a clean washcloth.   Apply the CHG soap to your body ONLY FROM THE NECK DOWN.  Do not use on open wounds or open sores.  Avoid contact with your eyes, ears, mouth, and genitals (private parts).  Wash face and genitals (private parts) with your normal soap.   Wash thoroughly, paying special attention to the area where your surgery will be performed.   Thoroughly rinse your body with warm water from the neck down.   DO NOT shower/wash with your normal soap after using and rinsing off the CHG soap.     Pat yourself dry with a CLEAN TOWEL.     Apply benzoyl peroxide.    Wear CLEAN PAJAMAS to bed the night before surgery; wear comfortable clothes the morning of surgery.   Place CLEAN SHEETS on your bed the night of your first shower and DO NOT SLEEP WITH PETS.   Day of Surgery: Shower as above Do not apply any deodorants/lotions.  Please wear clean clothes to the hospital/surgery center.   Remember to brush your teeth WITH YOUR REGULAR TOOTHPASTE.        If you received a COVID test during your pre-op visit, it is requested that you wear a mask when out in public, stay away from anyone that may not be feeling well, and notify your surgeon if you develop symptoms. If you have been in contact with anyone that has tested positive in the last 10 days, please notify your surgeon.     Please read over the following fact sheets that you  were given.

## 2024-02-24 NOTE — Anesthesia Preprocedure Evaluation (Addendum)
 Anesthesia Evaluation  Patient identified by MRN, date of birth, ID band Patient awake    Reviewed: Allergy & Precautions, H&P , NPO status , Patient's Chart, lab work & pertinent test results  Airway Mallampati: II  TM Distance: >3 FB Neck ROM: Full    Dental no notable dental hx.    Pulmonary Current Smoker and Patient abstained from smoking.   Pulmonary exam normal breath sounds clear to auscultation       Cardiovascular hypertension, Normal cardiovascular exam Rhythm:Regular Rate:Normal     Neuro/Psych  PSYCHIATRIC DISORDERS Anxiety Depression Bipolar Disorder   negative neurological ROS     GI/Hepatic Neg liver ROS,GERD  ,,  Endo/Other  negative endocrine ROS    Renal/GU negative Renal ROS  negative genitourinary   Musculoskeletal  (+) Arthritis ,    Abdominal   Peds negative pediatric ROS (+)  Hematology negative hematology ROS (+)   Anesthesia Other Findings   Reproductive/Obstetrics negative OB ROS                              Anesthesia Physical Anesthesia Plan  ASA: 3  Anesthesia Plan: General   Post-op Pain Management: Ofirmev IV (intra-op)* and Regional block*   Induction: Intravenous  PONV Risk Score and Plan: 2 and Ondansetron and Dexamethasone  Airway Management Planned: Oral ETT  Additional Equipment: None  Intra-op Plan:   Post-operative Plan: Extubation in OR  Informed Consent: I have reviewed the patients History and Physical, chart, labs and discussed the procedure including the risks, benefits and alternatives for the proposed anesthesia with the patient or authorized representative who has indicated his/her understanding and acceptance.     Dental advisory given  Plan Discussed with: CRNA  Anesthesia Plan Comments: (PAT note written 02/24/2024 by Allison Zelenak, PA-C.  )        Anesthesia Quick Evaluation

## 2024-03-01 ENCOUNTER — Ambulatory Visit (HOSPITAL_BASED_OUTPATIENT_CLINIC_OR_DEPARTMENT_OTHER): Admitting: Anesthesiology

## 2024-03-01 ENCOUNTER — Ambulatory Visit (HOSPITAL_COMMUNITY): Payer: Self-pay | Admitting: Vascular Surgery

## 2024-03-01 ENCOUNTER — Ambulatory Visit (HOSPITAL_COMMUNITY)
Admission: RE | Admit: 2024-03-01 | Discharge: 2024-03-01 | Disposition: A | Discharge status: Home / Self Care | Attending: Orthopedic Surgery | Admitting: Orthopedic Surgery

## 2024-03-01 ENCOUNTER — Encounter (HOSPITAL_COMMUNITY)
Admission: RE | Disposition: A | Discharge status: Home / Self Care | Payer: Self-pay | Source: Home / Self Care | Attending: Orthopedic Surgery

## 2024-03-01 ENCOUNTER — Other Ambulatory Visit: Payer: Self-pay

## 2024-03-01 ENCOUNTER — Encounter (HOSPITAL_COMMUNITY): Payer: Self-pay | Admitting: Orthopedic Surgery

## 2024-03-01 DIAGNOSIS — F419 Anxiety disorder, unspecified: Secondary | ICD-10-CM | POA: Diagnosis not present

## 2024-03-01 DIAGNOSIS — M75101 Unspecified rotator cuff tear or rupture of right shoulder, not specified as traumatic: Secondary | ICD-10-CM

## 2024-03-01 DIAGNOSIS — M7521 Bicipital tendinitis, right shoulder: Secondary | ICD-10-CM | POA: Diagnosis not present

## 2024-03-01 DIAGNOSIS — F17210 Nicotine dependence, cigarettes, uncomplicated: Secondary | ICD-10-CM | POA: Diagnosis not present

## 2024-03-01 DIAGNOSIS — M75121 Complete rotator cuff tear or rupture of right shoulder, not specified as traumatic: Secondary | ICD-10-CM

## 2024-03-01 DIAGNOSIS — I10 Essential (primary) hypertension: Secondary | ICD-10-CM | POA: Insufficient documentation

## 2024-03-01 DIAGNOSIS — Z01818 Encounter for other preprocedural examination: Secondary | ICD-10-CM

## 2024-03-01 DIAGNOSIS — M19011 Primary osteoarthritis, right shoulder: Secondary | ICD-10-CM | POA: Insufficient documentation

## 2024-03-01 DIAGNOSIS — M65911 Unspecified synovitis and tenosynovitis, right shoulder: Secondary | ICD-10-CM | POA: Insufficient documentation

## 2024-03-01 DIAGNOSIS — K219 Gastro-esophageal reflux disease without esophagitis: Secondary | ICD-10-CM | POA: Diagnosis not present

## 2024-03-01 DIAGNOSIS — Z8249 Family history of ischemic heart disease and other diseases of the circulatory system: Secondary | ICD-10-CM | POA: Insufficient documentation

## 2024-03-01 DIAGNOSIS — S43431A Superior glenoid labrum lesion of right shoulder, initial encounter: Secondary | ICD-10-CM

## 2024-03-01 DIAGNOSIS — X58XXXA Exposure to other specified factors, initial encounter: Secondary | ICD-10-CM | POA: Insufficient documentation

## 2024-03-01 DIAGNOSIS — F319 Bipolar disorder, unspecified: Secondary | ICD-10-CM | POA: Diagnosis not present

## 2024-03-01 HISTORY — PX: SHOULDER ARTHROSCOPY WITH OPEN ROTATOR CUFF REPAIR: SHX6092

## 2024-03-01 HISTORY — PX: BICEPT TENODESIS: SHX5116

## 2024-03-01 SURGERY — ARTHROSCOPY, SHOULDER WITH REPAIR, ROTATOR CUFF, OPEN
Anesthesia: General | Laterality: Right

## 2024-03-01 MED ORDER — PROPOFOL 1000 MG/100ML IV EMUL
INTRAVENOUS | Status: AC
  Filled 2024-03-01: qty 100

## 2024-03-01 MED ORDER — EPHEDRINE 5 MG/ML INJ
INTRAVENOUS | Status: AC
  Filled 2024-03-01: qty 5

## 2024-03-01 MED ORDER — ONDANSETRON HCL 4 MG/2ML IJ SOLN
INTRAMUSCULAR | Status: DC | PRN
  Administered 2024-03-01: 4 mg via INTRAVENOUS

## 2024-03-01 MED ORDER — BUPIVACAINE LIPOSOME 1.3 % IJ SUSP
INTRAMUSCULAR | Status: DC | PRN
  Administered 2024-03-01: 10 mL

## 2024-03-01 MED ORDER — DEXAMETHASONE SODIUM PHOSPHATE 10 MG/ML IJ SOLN
INTRAMUSCULAR | Status: DC | PRN
  Administered 2024-03-01: 10 mg via INTRAVENOUS

## 2024-03-01 MED ORDER — TRANEXAMIC ACID-NACL 1000-0.7 MG/100ML-% IV SOLN
1000.0000 mg | INTRAVENOUS | Status: AC
  Administered 2024-03-01: 1000 mg via INTRAVENOUS
  Filled 2024-03-01: qty 100

## 2024-03-01 MED ORDER — PHENYLEPHRINE HCL-NACL 20-0.9 MG/250ML-% IV SOLN
INTRAVENOUS | Status: DC | PRN
  Administered 2024-03-01: 40 ug/min via INTRAVENOUS

## 2024-03-01 MED ORDER — FENTANYL CITRATE (PF) 100 MCG/2ML IJ SOLN: 25.0000 ug | INTRAMUSCULAR | Status: DC | PRN

## 2024-03-01 MED ORDER — PHENYLEPHRINE 80 MCG/ML (10ML) SYRINGE FOR IV PUSH (FOR BLOOD PRESSURE SUPPORT)
PREFILLED_SYRINGE | INTRAVENOUS | Status: AC
  Filled 2024-03-01: qty 10

## 2024-03-01 MED ORDER — PROPOFOL 10 MG/ML IV BOLUS
INTRAVENOUS | Status: AC
  Filled 2024-03-01: qty 20

## 2024-03-01 MED ORDER — FENTANYL CITRATE (PF) 250 MCG/5ML IJ SOLN
INTRAMUSCULAR | Status: DC | PRN
  Administered 2024-03-01 (×3): 50 ug via INTRAVENOUS
  Administered 2024-03-01: 100 ug via INTRAVENOUS

## 2024-03-01 MED ORDER — SODIUM CHLORIDE 0.9 % IR SOLN
Status: DC | PRN
  Administered 2024-03-01: 1000 mL

## 2024-03-01 MED ORDER — PROPOFOL 10 MG/ML IV BOLUS
INTRAVENOUS | Status: DC | PRN
  Administered 2024-03-01: 75 ug/kg/min via INTRAVENOUS
  Administered 2024-03-01: 150 mg via INTRAVENOUS
  Administered 2024-03-01: 50 mg via INTRAVENOUS

## 2024-03-01 MED ORDER — FENTANYL CITRATE (PF) 250 MCG/5ML IJ SOLN
INTRAMUSCULAR | Status: AC
  Filled 2024-03-01: qty 5

## 2024-03-01 MED ORDER — SUGAMMADEX SODIUM 200 MG/2ML IV SOLN
INTRAVENOUS | Status: DC | PRN
  Administered 2024-03-01: 200 mg via INTRAVENOUS

## 2024-03-01 MED ORDER — TRANEXAMIC ACID-NACL 1000-0.7 MG/100ML-% IV SOLN
INTRAVENOUS | Status: AC
  Filled 2024-03-01: qty 100

## 2024-03-01 MED ORDER — ORAL CARE MOUTH RINSE: 15.0000 mL | Freq: Once | OROMUCOSAL | Status: AC

## 2024-03-01 MED ORDER — DROPERIDOL 2.5 MG/ML IJ SOLN: 0.6250 mg | Freq: Once | INTRAMUSCULAR | Status: DC | PRN

## 2024-03-01 MED ORDER — CEFAZOLIN SODIUM-DEXTROSE 2-4 GM/100ML-% IV SOLN
2.0000 g | INTRAVENOUS | Status: AC
  Administered 2024-03-01: 2 g via INTRAVENOUS
  Filled 2024-03-01: qty 100

## 2024-03-01 MED ORDER — BUPIVACAINE HCL (PF) 0.5 % IJ SOLN
INTRAMUSCULAR | Status: DC | PRN
  Administered 2024-03-01: 10 mL

## 2024-03-01 MED ORDER — OXYCODONE HCL 5 MG/5ML PO SOLN: 5.0000 mg | Freq: Once | ORAL | Status: DC | PRN

## 2024-03-01 MED ORDER — MIDAZOLAM HCL 2 MG/2ML IJ SOLN
INTRAMUSCULAR | Status: DC | PRN
  Administered 2024-03-01: 2 mg via INTRAVENOUS

## 2024-03-01 MED ORDER — EPHEDRINE SULFATE-NACL 50-0.9 MG/10ML-% IV SOSY
PREFILLED_SYRINGE | INTRAVENOUS | Status: DC | PRN
  Administered 2024-03-01 (×2): 5 mg via INTRAVENOUS
  Administered 2024-03-01: 10 mg via INTRAVENOUS
  Administered 2024-03-01: 5 mg via INTRAVENOUS

## 2024-03-01 MED ORDER — ROCURONIUM BROMIDE 10 MG/ML (PF) SYRINGE
PREFILLED_SYRINGE | INTRAVENOUS | Status: AC
Start: 2024-03-01 — End: ?
  Filled 2024-03-01: qty 10

## 2024-03-01 MED ORDER — CHLORHEXIDINE GLUCONATE 0.12 % MT SOLN
15.0000 mL | Freq: Once | OROMUCOSAL | Status: AC
  Administered 2024-03-01: 15 mL via OROMUCOSAL
  Filled 2024-03-01: qty 15

## 2024-03-01 MED ORDER — LIDOCAINE 2% (20 MG/ML) 5 ML SYRINGE
INTRAMUSCULAR | Status: AC
  Filled 2024-03-01: qty 5

## 2024-03-01 MED ORDER — OXYCODONE HCL 5 MG PO TABS: 5.0000 mg | ORAL_TABLET | Freq: Once | ORAL | Status: DC | PRN

## 2024-03-01 MED ORDER — DEXMEDETOMIDINE HCL IN NACL 80 MCG/20ML IV SOLN
INTRAVENOUS | Status: AC
  Filled 2024-03-01: qty 20

## 2024-03-01 MED ORDER — LIDOCAINE 2% (20 MG/ML) 5 ML SYRINGE
INTRAMUSCULAR | Status: DC | PRN
  Administered 2024-03-01: 60 mg via INTRAVENOUS

## 2024-03-01 MED ORDER — ROCURONIUM BROMIDE 10 MG/ML (PF) SYRINGE
PREFILLED_SYRINGE | INTRAVENOUS | Status: AC
  Filled 2024-03-01: qty 10

## 2024-03-01 MED ORDER — GLYCOPYRROLATE PF 0.2 MG/ML IJ SOSY
PREFILLED_SYRINGE | INTRAMUSCULAR | Status: DC | PRN
Start: 2024-03-01 — End: 2024-03-01
  Administered 2024-03-01 (×2): .1 mg via INTRAVENOUS

## 2024-03-01 MED ORDER — ROCURONIUM BROMIDE 10 MG/ML (PF) SYRINGE
PREFILLED_SYRINGE | INTRAVENOUS | Status: DC | PRN
Start: 2024-03-01 — End: 2024-03-01
  Administered 2024-03-01: 70 mg via INTRAVENOUS

## 2024-03-01 MED ORDER — METHOCARBAMOL 500 MG PO TABS: 500.0000 mg | ORAL_TABLET | Freq: Three times a day (TID) | ORAL | 1 refills | Status: DC | PRN

## 2024-03-01 MED ORDER — ONDANSETRON HCL 4 MG/2ML IJ SOLN
INTRAMUSCULAR | Status: AC
  Filled 2024-03-01: qty 2

## 2024-03-01 MED ORDER — SODIUM CHLORIDE (PF) 0.9 % IJ SOLN
INTRAMUSCULAR | Status: DC | PRN
  Administered 2024-03-01 (×2): 3000 mL

## 2024-03-01 MED ORDER — DEXAMETHASONE SODIUM PHOSPHATE 10 MG/ML IJ SOLN
INTRAMUSCULAR | Status: AC
  Filled 2024-03-01: qty 1

## 2024-03-01 MED ORDER — DEXMEDETOMIDINE HCL IN NACL 80 MCG/20ML IV SOLN
INTRAVENOUS | Status: DC | PRN
  Administered 2024-03-01: 12 ug via INTRAVENOUS

## 2024-03-01 MED ORDER — CELECOXIB 100 MG PO CAPS: 100.0000 mg | ORAL_CAPSULE | Freq: Two times a day (BID) | ORAL | 0 refills | Status: DC

## 2024-03-01 MED ORDER — SUCCINYLCHOLINE CHLORIDE 200 MG/10ML IV SOSY
PREFILLED_SYRINGE | INTRAVENOUS | Status: AC
  Filled 2024-03-01: qty 10

## 2024-03-01 MED ORDER — ALBUTEROL SULFATE HFA 108 (90 BASE) MCG/ACT IN AERS
INHALATION_SPRAY | RESPIRATORY_TRACT | Status: DC | PRN
  Administered 2024-03-01: 3 via RESPIRATORY_TRACT

## 2024-03-01 MED ORDER — SODIUM CHLORIDE 0.9 % IV SOLN: INTRAVENOUS | Status: DC | PRN

## 2024-03-01 MED ORDER — PHENYLEPHRINE 80 MCG/ML (10ML) SYRINGE FOR IV PUSH (FOR BLOOD PRESSURE SUPPORT)
PREFILLED_SYRINGE | INTRAVENOUS | Status: DC | PRN
  Administered 2024-03-01: 55 ug via INTRAVENOUS
  Administered 2024-03-01 (×2): 80 ug via INTRAVENOUS
  Administered 2024-03-01: 70 ug via INTRAVENOUS
  Administered 2024-03-01: 80 ug via INTRAVENOUS

## 2024-03-01 MED ORDER — PHENYLEPHRINE 80 MCG/ML (10ML) SYRINGE FOR IV PUSH (FOR BLOOD PRESSURE SUPPORT)
PREFILLED_SYRINGE | INTRAVENOUS | Status: AC
  Filled 2024-03-01: qty 20

## 2024-03-01 MED ORDER — ACETAMINOPHEN 10 MG/ML IV SOLN: 1000.0000 mg | Freq: Once | INTRAVENOUS | Status: DC | PRN

## 2024-03-01 MED ORDER — EPINEPHRINE PF 1 MG/ML IJ SOLN
INTRAMUSCULAR | Status: AC
  Filled 2024-03-01: qty 2

## 2024-03-01 MED ORDER — SUGAMMADEX SODIUM 200 MG/2ML IV SOLN: INTRAVENOUS | Status: DC | PRN

## 2024-03-01 MED ORDER — MIDAZOLAM HCL 2 MG/2ML IJ SOLN
INTRAMUSCULAR | Status: AC
  Filled 2024-03-01: qty 2

## 2024-03-01 MED ORDER — OXYCODONE HCL 5 MG PO TABS: 5.0000 mg | ORAL_TABLET | ORAL | 0 refills | Status: DC | PRN

## 2024-03-01 MED ORDER — LACTATED RINGERS IV SOLN: INTRAVENOUS | Status: DC

## 2024-03-01 SURGICAL SUPPLY — 66 items
ANCH BUTTON FIBERTAK KNEE SP (Anchor) IMPLANT
ANCH DBL 2.6 SLF-PNCH FIBERTAK (Anchor) ×1 IMPLANT
ANCHOR DBL 2.6 SLF-PNCH FIBRTK (Anchor) IMPLANT
ANCHOR FBRTK 2.6 SUTURETAP 1.3 (Anchor) IMPLANT
ANCHOR SUT BIOCOMP CORKSREW (Anchor) IMPLANT
ANCHOR SWIVELOCK BIO 4.75X19.1 (Anchor) IMPLANT
BAG COUNTER SPONGE SURGICOUNT (BAG) IMPLANT
BLADE EXCALIBUR 4.0X13 (MISCELLANEOUS) IMPLANT
BURR OVAL 8 FLU 4.0X13 (MISCELLANEOUS) IMPLANT
COVER SURGICAL LIGHT HANDLE (MISCELLANEOUS) ×1 IMPLANT
DRAPE INCISE IOBAN 66X45 STRL (DRAPES) ×2 IMPLANT
DRAPE STERI 35X30 U-POUCH (DRAPES) ×1 IMPLANT
DRAPE U-SHAPE 47X51 STRL (DRAPES) ×2 IMPLANT
DRSG TEGADERM 4X4.75 (GAUZE/BANDAGES/DRESSINGS) ×3 IMPLANT
DURAPREP 26ML APPLICATOR (WOUND CARE) ×1 IMPLANT
DW OUTFLOW CASSETTE/TUBE SET (MISCELLANEOUS) ×1 IMPLANT
ELECT REM PT RETURN 9FT ADLT (ELECTROSURGICAL) ×1 IMPLANT
ELECTRODE REM PT RTRN 9FT ADLT (ELECTROSURGICAL) ×1 IMPLANT
GAUZE SPONGE 4X4 12PLY STRL (GAUZE/BANDAGES/DRESSINGS) ×1 IMPLANT
GAUZE SPONGE 4X4 12PLY STRL LF (GAUZE/BANDAGES/DRESSINGS) ×1 IMPLANT
GAUZE XEROFORM 1X8 LF (GAUZE/BANDAGES/DRESSINGS) IMPLANT
GLOVE BIOGEL PI IND STRL 7.0 (GLOVE) ×1 IMPLANT
GLOVE BIOGEL PI IND STRL 8 (GLOVE) ×1 IMPLANT
GLOVE ECLIPSE 7.0 STRL STRAW (GLOVE) ×1 IMPLANT
GLOVE ECLIPSE 8.0 STRL XLNG CF (GLOVE) ×1 IMPLANT
GOWN STRL REUS W/ TWL LRG LVL3 (GOWN DISPOSABLE) ×3 IMPLANT
KIT BASIN OR (CUSTOM PROCEDURE TRAY) ×1 IMPLANT
KIT KNEE FIBERTAK DISP (KITS) IMPLANT
KIT TURNOVER KIT B (KITS) ×1 IMPLANT
MANIFOLD NEPTUNE II (INSTRUMENTS) ×1 IMPLANT
NDL HD SCORPION MEGA LOADER (NEEDLE) IMPLANT
NDL SCORPION MULTI FIRE (NEEDLE) IMPLANT
NDL SPNL 18GX3.5 QUINCKE PK (NEEDLE) ×1 IMPLANT
NDL SUT 6 .5 CRC .975X.05 MAYO (NEEDLE) IMPLANT
NDL TAPERED W/ NITINOL LOOP (MISCELLANEOUS) IMPLANT
NEEDLE SCORPION MULTI FIRE (NEEDLE) IMPLANT
NEEDLE SPNL 18GX3.5 QUINCKE PK (NEEDLE) ×1 IMPLANT
NEEDLE TAPERED W/ NITINOL LOOP (MISCELLANEOUS) ×1 IMPLANT
NS IRRIG 1000ML POUR BTL (IV SOLUTION) ×1 IMPLANT
PACK SHOULDER (CUSTOM PROCEDURE TRAY) ×1 IMPLANT
PAD ARMBOARD POSITIONER FOAM (MISCELLANEOUS) ×2 IMPLANT
PROBE APOLLO 90XL (SURGICAL WAND) ×1 IMPLANT
PUSHLOCK PEEK 4.5X24 (Orthopedic Implant) IMPLANT
RESTRAINT HEAD UNIVERSAL NS (MISCELLANEOUS) ×1 IMPLANT
SLING ARM IMMOBILIZER LRG (SOFTGOODS) IMPLANT
SPONGE T-LAP 4X18 ~~LOC~~+RFID (SPONGE) ×2 IMPLANT
STRIP CLOSURE SKIN 1/2X4 (GAUZE/BANDAGES/DRESSINGS) ×1 IMPLANT
SUCTION TUBE FRAZIER 10FR DISP (SUCTIONS) ×1 IMPLANT
SUT 0 FIBERLOOP 38 BLUE TPR ND (SUTURE) ×2 IMPLANT
SUT ETHILON 3 0 PS 1 (SUTURE) ×1 IMPLANT
SUT FIBERWIRE #2 38 T-5 BLUE (SUTURE) IMPLANT
SUT MNCRL AB 3-0 PS2 18 (SUTURE) ×1 IMPLANT
SUT VIC AB 0 CT1 27XBRD ANBCTR (SUTURE) ×1 IMPLANT
SUT VIC AB 1 CT1 27XBRD ANBCTR (SUTURE) IMPLANT
SUT VIC AB 2-0 CT2 27 (SUTURE) ×1 IMPLANT
SUT VICRYL 0 UR6 27IN ABS (SUTURE) ×1 IMPLANT
SUT VICRYL 1 TIES 12X18 (SUTURE) ×1 IMPLANT
SUTURE 0 FIBERLP 38 BLU TPR ND (SUTURE) IMPLANT
SUTURE FIBERWR #2 38 T-5 BLUE (SUTURE) IMPLANT
SUTURE TAPE 1.3 FIBERLOP 20 ST (SUTURE) IMPLANT
SUTURETAPE 1.3 FIBERLOOP 20 ST (SUTURE) IMPLANT
TAG SUT 36.6 CIRCLE TAP NDL (SUTURE) IMPLANT
TOWEL GREEN STERILE (TOWEL DISPOSABLE) ×1 IMPLANT
TOWEL GREEN STERILE FF (TOWEL DISPOSABLE) ×1 IMPLANT
TUBING ARTHROSCOPY IRRIG 16FT (MISCELLANEOUS) ×1 IMPLANT
WATER STERILE IRR 1000ML POUR (IV SOLUTION) ×1 IMPLANT

## 2024-03-01 NOTE — Brief Op Note (Signed)
   03/01/2024  10:12 AM  PATIENT:  Clide Dalton  58 y.o. female  PRE-OPERATIVE DIAGNOSIS:  right shoulder rotator cuff tear, bicep tendinitis  POST-OPERATIVE DIAGNOSIS:  right shoulder rotator cuff tear, bicep tendinitis  PROCEDURE:  Procedure(s): RIGHT SHOULDER ARTHROSCOPY, DEBRIDEMENT, MINI OPEN BICEP TENODESIS AND ROTATOR CUFF TEAR REPAIR TENODESIS, BICEPS  SURGEON:  Surgeon(s): Rozelle Corning, Maricela Shoe, MD  ASSISTANT: magnant pa  ANESTHESIA:   general  EBL: 25 ml    Total I/O In: 1000 [I.V.:1000] Out: 30 [Blood:30]  BLOOD ADMINISTERED: none  DRAINS: none   LOCAL MEDICATIONS USED:  none  SPECIMEN:  No Specimen  COUNTS:  YES  TOURNIQUET:  * No tourniquets in log *  DICTATION: .Other Dictation: Dictation Number  [id number]  PLAN OF CARE: Discharge to home after PACU  PATIENT DISPOSITION:  PACU - hemodynamically stable

## 2024-03-01 NOTE — Op Note (Signed)
 [name], [name] MEDICAL RECORD NO: [id number] ACCOUNT NO: [id number] DATE OF BIRTH: [birth year] FACILITY: [facility] LOCATION: [facility] PHYSICIAN: Graylin Shiver. August Saucer, MD  Operative Report   DATE OF PROCEDURE: 03/01/2024  PREOPERATIVE DIAGNOSES:  Right shoulder rotator cuff tear and biceps tendinitis.  POSTOPERATIVE DIAGNOSES:  Right shoulder rotator cuff tear, 2 x 2 cm supraspinatus with degenerative SLAP tear and biceps tendinitis.  PROCEDURE:  Right shoulder arthroscopy with debridement of the rotator cuff tear, superior labrum, and biceps tendon with subsequent mini-open biceps tenodesis and rotator cuff tear repair using 2 x 3 construct with margin convergence using 4-0 FiberWire  sutures.  SURGEON:  Graylin Shiver. August Saucer, MD  ASSISTANT:  Karenann Cai, PA  INDICATIONS:  The patient is a 58 year old with right shoulder pain who presents for operative management after explanation of risks and benefits.  She has rotator cuff tearing by CT arthrogram.  The patient presents for operative management after  explanation of risks, and benefits.  DESCRIPTION OF PROCEDURE:  The patient was brought to the operating room where a general anesthetic was induced.  Preoperative antibiotics administered.  Timeout was called.  The patient was placed in the beach chair position with the head in neutral  position.  Right shoulder was examined under anesthesia and found to have range of motion of 70/100/165.  The right shoulder, arm, and hand were prescrubbed with alcohol and Betadine allowed to air dry, prepped with DuraPrep solution and draped in a  sterile manner.  Ioban used to seal the operative field and cover the axilla.  After calling timeout, posterior portal was created 2 cm medial and inferior to the posterolateral margin of the acromion.  Diagnostic arthroscopy was performed.  The patient  did have a degenerative SLAP tear with significant instability of the biceps anchor as well as degeneration of  the superior labrum from 10 o'clock to 2 o'clock.  Biceps tendinitis was also present.  Glenohumeral articular surface was intact.  Mild  synovitis present within the rotator interval.  Rotator cuff tear, which appeared more degenerative in nature, was present involving the supraspinatus.  Approximately the size of my thumbprint in a U-shaped tear around 2 x 2 cm.  Tissue mobility was  good.  Following debridement of the rotator cuff tear as well as debridement of the superior labrum and release of the biceps tendon after creation of an anterior portal under direct visualization, the instruments were removed.  Portals were closed using  3-0 nylon.  [name] was then used to cover the entire operative field.  Incision was made off the anterolateral margin of the acromion.  Skin and subcutaneous tissues were sharply divided.  The deltoid was split between the anterior and middle raphe.  It  was split in the raphe between the anteromedial bundles of the deltoid.  Stay suture was placed 4 cm from the anterolateral margin of the acromion.  Bursectomy was performed.  Rotator cuff tear identified.  The edges of the rotator cuff tear were  debrided.  Small spur present, which was removed using a rasp.  At this time, the transverse humeral ligament was then opened and the biceps tendon was tenodesed into the bicipital groove under appropriate tension using knotless suture anchors proximally  and distally.  Secure fixation was achieved.  Next, we turned our attention towards the rotator cuff tear.  The rotator cuff tear was debrided to a U-shaped tear.  Four margin convergent sutures were placed consisting of 0 FiberWire sutures.  These were  not  tied.  Two knotless Arthrex suture anchors were placed at the junction of the articular surface and the tuberosity.  The SutureTapes were then passed with the Scorpion from posterior to anterior x8.  At this time, the margin convergent sutures were  tied.  We placed one extra 0  Vicryl suture at the lateral flap of the margin convergence on each side.  Then, with the tendon reduced to the footprint, the SutureTapes were tied and crossed.  Then with the arm in abduction, we placed the original margin  convergent sutures into a SwiveLock with good fixation.  Then the posterior and anterior SutureTapes, which had been crossed, were then placed into a SwiveLock with very good fixation achieved.  We did use three of the accessory suture mechanisms in the  SwiveLocks for added fixation on the rotator cuff tear.  Watertight repair was achieved.  Thorough irrigation was then performed.  Arm was taken through range of motion and found to have good range of motion with good stability of the repair.  Next, it  should be noted that we prepared the landing zone and footprint with a rongeur prior to placement of the anchors.  Following thorough irrigation, the deltoid split was closed using #1 Vicryl suture followed by interrupted inverted 0 Vicryl suture, 2-0  Vicryl suture, and 3-0 Monocryl with Steri-Strips and impervious dressing applied along with a shoulder sling.  [name]'s assistance was required for retraction, opening and closing, mobilization of tissue.  His assistance was of medical necessity.   PUS D: 03/01/2024 10:19:28 am T: 03/01/2024 11:02:00 am  JOB: [id number]/ [id number]

## 2024-03-01 NOTE — Transfer of Care (Signed)
 Immediate Anesthesia Transfer of Care Note  Patient: [name]  Procedure(s) Performed: RIGHT SHOULDER ARTHROSCOPY, DEBRIDEMENT, MINI OPEN BICEP TENODESIS AND ROTATOR CUFF TEAR REPAIR (Right) TENODESIS, BICEPS (Right)  Patient Location: PACU  Anesthesia Type:GA combined with regional for post-op pain  Level of Consciousness: awake and drowsy  Airway & Oxygen Therapy: Patient Spontanous Breathing and Patient connected to face mask oxygen  Post-op Assessment: Report given to RN and Post -op Vital signs reviewed and stable  Post vital signs: Reviewed and stable  Last Vitals:  Vitals Value Taken Time  BP 96/58 03/01/24 1031  Temp 36.8 C 03/01/24 1030  Pulse 62 03/01/24 1039  Resp 22 03/01/24 1039  SpO2 95 % 03/01/24 1039  Vitals shown include unfiled device data.  Last Pain:  Vitals:   03/01/24 1030  TempSrc:   PainSc: 0-No pain         Complications: No notable events documented.

## 2024-03-01 NOTE — Anesthesia Procedure Notes (Signed)
 Procedure Name: Intubation Date/Time: 03/01/2024 7:43 AM  Performed by: Marcelle Sergeant, CRNAPre-anesthesia Checklist: Patient identified, Emergency Drugs available, Suction available and Patient being monitored Patient Re-evaluated:Patient Re-evaluated prior to induction Oxygen Delivery Method: Circle system utilized Preoxygenation: Pre-oxygenation with 100% oxygen Induction Type: IV induction Ventilation: Mask ventilation without difficulty Laryngoscope Size: Mac and 3 Grade View: Grade I Tube type: Oral Tube size: 7.0 mm Number of attempts: 1 Airway Equipment and Method: Stylet and Oral airway Placement Confirmation: ETT inserted through vocal cords under direct vision, positive ETCO2 and breath sounds checked- equal and bilateral Tube secured with: Tape Dental Injury: Teeth and Oropharynx as per pre-operative assessment

## 2024-03-01 NOTE — Anesthesia Postprocedure Evaluation (Signed)
 Anesthesia Post Note  Patient: [name]  Procedure(s) Performed: RIGHT SHOULDER ARTHROSCOPY, DEBRIDEMENT, MINI OPEN BICEP TENODESIS AND ROTATOR CUFF TEAR REPAIR (Right) TENODESIS, BICEPS (Right)     Patient location during evaluation: PACU Anesthesia Type: General and Regional Level of consciousness: awake and alert Pain management: pain level controlled Vital Signs Assessment: post-procedure vital signs reviewed and stable Respiratory status: spontaneous breathing, nonlabored ventilation, respiratory function stable and patient connected to nasal cannula oxygen Cardiovascular status: blood pressure returned to baseline and stable Postop Assessment: no apparent nausea or vomiting Anesthetic complications: no   No notable events documented.  Last Vitals:  Vitals:   03/01/24 1045 03/01/24 1100  BP: (!) 118/51 (!) 124/58  Pulse: 63 69  Resp: 20 18  Temp:  36.8 C  SpO2: 96% 97%    Last Pain:  Vitals:   03/01/24 1100  TempSrc:   PainSc: 0-No pain                 Lethaniel Rave

## 2024-03-01 NOTE — Anesthesia Procedure Notes (Signed)
 Anesthesia Regional Block: Interscalene brachial plexus block   Pre-Anesthetic Checklist: , timeout performed,  Correct Patient, Correct Site, Correct Laterality,  Correct Procedure, Correct Position, site marked,  Risks and benefits discussed,  Surgical consent,  Pre-op evaluation,  At surgeon's request and post-op pain management  Laterality: Right  Prep: chloraprep       Needles:  Injection technique: Single-shot  Needle Type: Echogenic Stimulator Needle     Needle Length: 9cm  Needle Gauge: 21     Additional Needles:   Procedures:,,,, ultrasound used (permanent image in chart),,    Narrative:  Start time: 03/01/2024 7:10 AM End time: 03/01/2024 7:15 AM Injection made incrementally with aspirations every 5 mL.  Performed by: Personally  Anesthesiologist: Lethaniel Rave, MD  Additional Notes: Discussed risks and benefits of the nerve block in detail, including but not limited vascular injury, permanent nerve damage and infection.   Patient tolerated the procedure well. Local anesthetic introduced in an incremental fashion under minimal resistance after negative aspirations. No paresthesias were elicited. After completion of the procedure, no acute issues were identified and patient continued to be monitored by RN.

## 2024-03-01 NOTE — H&P (Signed)
 [name] is an 58 y.o. female.   Chief Complaint: right shoulder pain HPI: [name] is a 58 y.o. female who presents  reporting right shoulder pain.  Since she was last seen she has had a CT arthrogram..  On my review patient does have a nonretracted 95% thickness tear of the supraspinatus with likely punctate area allowing fluid to extravasate into the bursal cavity.  Not too much in terms of arthritis.  Biceps anchor appears intact.   Patient has had 2 injections.  Has had 2 days of relief from those injections but then the pain recurs.  She is able to put her hand behind her back.  She cannot sleep on the right-hand side.  Pain is a bigger problem than weakness in the right shoulder.  Used to be that her symptoms were better with the arm overhead but not currently.  Hard for her to work.  Father is in rehab.  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  Denies any scapular pain.  Does have episodic hand numbness and tingling.  Symptoms have been ongoing now for 4 to 5 years.  Denies any cardiac history.  Past Medical History:  Diagnosis Date   Anxiety    Arthritis    ASCUS favor benign 09/2014, 03/2015   negative high risk HPV 09/2014   Broken ankle    Depression    Enlarged thyroid    GERD (gastroesophageal reflux disease)    Heart murmur    HTN (hypertension)     Past Surgical History:  Procedure Laterality Date   ABDOMINAL HYSTERECTOMY  1993   endometriosis with pain   ABDOMINAL HYSTERECTOMY     ANKLE FRACTURE SURGERY Right    ANKLE SURGERY     CESAREAN SECTION     Neuro stimulator implant     orif lt wrist     TUBAL LIGATION  1997   TUBAL LIGATION      Family History  Problem Relation Age of Onset   Cancer Father        Brain tumor   Other Father        Brain tumor   Heart disease Father    Healthy Mother    Breast cancer Neg Hx    Social History:  reports that she has been smoking cigarettes. She has never used smokeless  tobacco. She reports current alcohol use. She reports that she does not use drugs.  Allergies:  Allergies  Allergen Reactions   Lisinopril Cough    Medications Prior to Admission  Medication Sig Dispense Refill   ALPRAZolam (XANAX) 0.5 MG tablet Take 0.5 mg by mouth daily as needed for anxiety.     buPROPion (WELLBUTRIN SR) 150 MG 12 hr tablet Take 1 tablet by mouth 2 (two) times daily.     DULoxetine (CYMBALTA) 30 MG capsule Take 30 mg by mouth in the morning, at noon, and at bedtime.     furosemide (LASIX) 40 MG tablet Take 40 mg by mouth daily as needed for fluid or edema.     omeprazole (PRILOSEC) 20 MG capsule Take 20 mg by mouth in the morning and at bedtime.     rosuvastatin (CRESTOR) 20 MG tablet Take 40 mg by mouth daily.     traMADol (ULTRAM) 50 MG tablet Take 1 tablet (50 mg total) by mouth every 12 (twelve) hours as needed. (Patient taking differently: Take 50 mg by mouth daily as needed for moderate  pain (pain score 4-6) or severe pain (pain score 7-10).) 20 tablet 0   valsartan (DIOVAN) 160 MG tablet Take 160 mg by mouth daily. Take with 40 mg tablet to equal 200 mg     valsartan (DIOVAN) 80 MG tablet Take 40 mg by mouth daily. Take with 160 mg tablet to equal 200 mg.     zolpidem (AMBIEN) 10 MG tablet Take 20 mg by mouth at bedtime as needed for sleep.     augmented betamethasone dipropionate (DIPROLENE-AF) 0.05 % cream Apply 1 Application topically 2 (two) times daily. (Patient not taking: Reported on 02/23/2024)     naproxen (NAPROSYN) 500 MG tablet Take 1 tablet (500 mg total) by mouth 2 (two) times daily. (Patient not taking: Reported on 02/23/2024) 30 tablet 0    No results found for this or any previous visit (from the past 48 hours). No results found.  Review of Systems  Musculoskeletal:  Positive for arthralgias.  All other systems reviewed and are negative.   Blood pressure 135/71, pulse 76, temperature 97.7 F (36.5 C), temperature source Oral, resp. rate 16,  height 5\' 7"  (1.702 m), weight 99.8 kg, SpO2 99%. Physical Exam Vitals reviewed.  HENT:     Head: Normocephalic.     Nose: Nose normal.     Mouth/Throat:     Mouth: Mucous membranes are moist.  Eyes:     Pupils: Pupils are equal, round, and reactive to light.  Cardiovascular:     Rate and Rhythm: Normal rate.     Pulses: Normal pulses.  Pulmonary:     Effort: Pulmonary effort is normal.  Abdominal:     General: Abdomen is flat.  Musculoskeletal:     Cervical back: Normal range of motion.  Skin:    General: Skin is warm.     Capillary Refill: Capillary refill takes less than 2 seconds.  Neurological:     General: No focal deficit present.     Mental Status: She is alert.  Psychiatric:        Mood and Affect: Mood normal.    Ortho exam demonstrates pretty good external rotation strength bilaterally 5 out of 5. Subscap strength 5+ out of 5. Mild slight crepitus with internal/external rotation on the right at 90 degrees of abduction. No discrete AC joint tenderness right versus left. No pain with crossarm adduction on the right versus left. Does have bicipital groove tenderness and positive O'Brien's testing on the right. Deltoid is functional.. No masses lymphadenopathy or skin changes present in that shoulder girdle region.   Assessment/Plan Impression is right shoulder rotator cuff tear which has at least punctate full-thickness component. The tear itself looks relatively nonretracted and there is no atrophy visible in the muscle belly. Plan at this time is arthroscopy with evaluation of the biceps tendon with possible tenodesis and mini open rotator cuff tear repair. Patient is a smoker. That will inhibit some of the healing that is required for this procedure to be successful. Risk and benefits including but limited to infection or vessel damage incomplete pain relief as well as incomplete restoration of function are all discussed. The rigorous nature of the rehabilitative process was  also discussed. All questions answered.   Burnard Bunting, MD 03/01/2024, 6:43 AM

## 2024-03-02 ENCOUNTER — Encounter (HOSPITAL_COMMUNITY): Payer: Self-pay | Admitting: Orthopedic Surgery

## 2024-03-03 ENCOUNTER — Telehealth: Payer: Self-pay | Admitting: Radiology

## 2024-03-03 ENCOUNTER — Encounter: Payer: Self-pay | Admitting: Radiology

## 2024-03-03 NOTE — Telephone Encounter (Signed)
 Patient asked is there any type of exercises she should be doing for her shoulder since insurance is not covering CPM  and she is not paying out of pocket for it.

## 2024-03-03 NOTE — Telephone Encounter (Signed)
 I'm not sure why she has albuterol inhaler, I didn't order/prescribe this and I dont see it listed in her chart or after visit summary from the hospital. Did she get this from another provider? A little bit of residual shortness of breath is normal from the interscalene block but if that continues or worsens or she has chest pain, I would recommend she reach out to us  or go to ER

## 2024-03-03 NOTE — Telephone Encounter (Signed)
 Patient called in this morning with a couple of questions post op.   4/15 RT shoulder scope with Dr. Rozelle Corning  Complains of shortness of breath when reclining back. States was sent home with albuterol inhaler but no instructions of use.   States that did not receive CPM machine due to having to pay out of pocket.  Patients states she called social services who informed her this should have been covered by Medicaid. [employer] will not cover.  She is asking to try and have this resubmitted with medicaid.   Call back [phone]

## 2024-03-03 NOTE — Telephone Encounter (Signed)
 Called and advised patient

## 2024-03-03 NOTE — Telephone Encounter (Signed)
 It was submitted for primary Medicare and secondary Medicaid. Neither cover shoulder CPM's so it will not be covered. Called patient and advised

## 2024-03-03 NOTE — Telephone Encounter (Signed)
 [name] can you help with the CPM?

## 2024-03-03 NOTE — Telephone Encounter (Signed)
 She can perform pendulum exercises (30 repetitions clockwise and then 30 repetitions counterclockwise) 3 times daily and then back in her sling.  No active lifting of her arm or any lifting with the operative arm.

## 2024-03-08 ENCOUNTER — Telehealth: Payer: Self-pay | Admitting: Orthopedic Surgery

## 2024-03-08 ENCOUNTER — Other Ambulatory Visit: Payer: Self-pay | Admitting: Surgical

## 2024-03-08 MED ORDER — OXYCODONE HCL 5 MG PO TABS: 5.0000 mg | ORAL_TABLET | ORAL | 0 refills | Status: DC | PRN

## 2024-03-08 NOTE — Telephone Encounter (Signed)
 Sent in

## 2024-03-08 NOTE — Telephone Encounter (Signed)
 Pt called requesting a refill of oxycodone . Please send to [facility] [place]. Pt phone number is [phone].

## 2024-03-09 ENCOUNTER — Ambulatory Visit (INDEPENDENT_AMBULATORY_CARE_PROVIDER_SITE_OTHER): Admitting: Surgical

## 2024-03-09 DIAGNOSIS — Z9889 Other specified postprocedural states: Secondary | ICD-10-CM

## 2024-03-13 ENCOUNTER — Encounter: Payer: Self-pay | Admitting: Surgical

## 2024-03-13 DIAGNOSIS — M75121 Complete rotator cuff tear or rupture of right shoulder, not specified as traumatic: Secondary | ICD-10-CM

## 2024-03-13 DIAGNOSIS — M7521 Bicipital tendinitis, right shoulder: Secondary | ICD-10-CM

## 2024-03-13 DIAGNOSIS — S43431A Superior glenoid labrum lesion of right shoulder, initial encounter: Secondary | ICD-10-CM

## 2024-03-13 NOTE — Progress Notes (Signed)
 Post-Op Visit Note   Patient: [name]           Date of Birth: 1966-03-29           MRN: [id number] Visit Date: 03/09/2024 PCP: [facility]   Assessment & Plan:  Chief Complaint:  Chief Complaint  Patient presents with   Right Shoulder - Routine Post Op    03/01/224 Right shoulder arthroscopy, deb, MOBT and RCR   Visit Diagnoses: No diagnosis found.  Plan: [name] is a 58 y.o. female who presents s/p right shoulder rotator cuff repair and biceps tenodesis on 03/01/2024.  Patient is doing well and pain is overall controlled. Denies any chest pain, SOB, fevers, chills.  Taking oxycodone  for pain control.  Had a lot of postop pain but it is overall controlled with the pain medicine.  Did have a fall where she slid on the wet floor 3 days after surgery and hit her right arm during the fall.  On exam, patient has range of motion 15 degrees X rotation, 80 degrees abduction, 90 degrees forward elevation passively.  Intact EPL, FPL, finger abduction, finger adduction, pronation/supination, bicep, tricep, deltoid of operative extremity.  Axillary nerve intact with deltoid firing.  Incisions are healing well without evidence of infection or dehiscence.  Sutures removed and replaced with Steri-Strips today.  2+ radial pulse of the operative extremity  Plan is continue with home exercise program that was prescribed today.  No active motion of the right shoulder and no lifting with the right arm.  Follow-up in 2 weeks for clinical recheck and initiation of physical therapy..   Follow-Up Instructions: No follow-ups on file.   Orders:  No orders of the defined types were placed in this encounter.  No orders of the defined types were placed in this encounter.   Imaging: No results found.  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   Heartburn 01/01/2016   Hypertension 01/01/2016   Obesity 01/01/2016   Osteoarthritis 01/01/2016   Peripheral neuropathy 01/01/2016   Snoring 01/01/2016   Sleep disturbance 07/18/2015   Arthritis of ankle 09/07/2014   Ankle fracture, right 07/29/2014   Chronic pain syndrome 08/01/2013   Past Medical History:  Diagnosis Date   Anxiety    Arthritis    ASCUS favor benign 09/2014, 03/2015   negative high risk HPV 09/2014   Broken ankle    Depression    Enlarged thyroid    GERD (gastroesophageal reflux disease)    Heart murmur    HTN (hypertension)     Family History  Problem Relation Age of Onset   Cancer Father        Brain tumor   Other Father        Brain tumor   Heart disease Father    Healthy Mother    Breast cancer Neg Hx     Past Surgical History:  Procedure Laterality Date   ABDOMINAL HYSTERECTOMY  1993   endometriosis with pain   ABDOMINAL HYSTERECTOMY     ANKLE  FRACTURE SURGERY Right    ANKLE SURGERY     BICEPT TENODESIS Right 03/01/2024   Procedure: TENODESIS, BICEPS;  Surgeon: Jasmine Mesi, MD;  Location[facility];  Service: Orthopedics;  Laterality: Right;   CESAREAN SECTION     Neuro stimulator implant     orif lt wrist     SHOULDER ARTHROSCOPY WITH OPEN ROTATOR CUFF REPAIR Right 03/01/2024   Procedure: RIGHT SHOULDER ARTHROSCOPY, DEBRIDEMENT, MINI OPEN BICEP TENODESIS AND ROTATOR CUFF TEAR REPAIR;  Surgeon: Jasmine Mesi, MD;  Location: [facility] OR;  Service: Orthopedics;  Laterality: Right;  RIGHT SHOULDER ARTHROSCOPY, DEBRIDEMENT, BICEPS TENODESIS, MINI OPEN ROTATOR CUFF  TEAR REPAIR   TUBAL LIGATION  1997   TUBAL LIGATION     Social History   Occupational History   Occupation: unemployed  Tobacco Use   Smoking status: Some Days    Types: Cigarettes   Smokeless tobacco: Never   Tobacco comments:    uses e-sig 01/08/16  Vaping Use   Vaping status: Every Day  Substance and Sexual Activity   Alcohol use: Yes    Comment: Occas   Drug use: No   Sexual activity: Not Currently    Birth control/protection: Surgical    Comment: HYST

## 2024-03-25 ENCOUNTER — Ambulatory Visit (INDEPENDENT_AMBULATORY_CARE_PROVIDER_SITE_OTHER): Admitting: Surgical

## 2024-03-25 DIAGNOSIS — Z9889 Other specified postprocedural states: Secondary | ICD-10-CM

## 2024-03-25 MED ORDER — METHOCARBAMOL 500 MG PO TABS: 500.0000 mg | ORAL_TABLET | Freq: Three times a day (TID) | ORAL | 1 refills | Status: AC | PRN

## 2024-03-25 MED ORDER — OXYCODONE HCL 5 MG PO TABS: 5.0000 mg | ORAL_TABLET | Freq: Three times a day (TID) | ORAL | 0 refills | Status: AC | PRN

## 2024-03-27 ENCOUNTER — Encounter: Payer: Self-pay | Admitting: Surgical

## 2024-03-27 NOTE — Progress Notes (Signed)
 Post-Op Visit Note   Patient: [name]           Date of Birth: 1966/08/30           MRN: [id number] Visit Date: 03/25/2024 [facility]: [facility]   Assessment & Plan:  Chief Complaint:  Chief Complaint  Patient presents with   Right Shoulder - Routine Post Op, Follow-up    03/01/2024 right shoulder arthroscopy, deb, MOBT, RCR   Visit Diagnoses:  1. S/P rotator cuff repair     Plan: Patient is a 58 year old female who presents s/p right shoulder arthroscopy with rotator cuff tear repair and bicep tenodesis on 03/01/2024.  Overall doing well.  Having more trapezius pain than actual shoulder pain at this point.  Taking methocarbamol  and oxycodone  as needed.  Has mostly been compliant with no active motion of the shoulder and no lifting but she has occasionally discontinued the sling and has been vacuuming and rug cleaning occasionally with the right arm though she mostly tries to use the left arm.  On exam, patient has 20 degrees external rotation, 85 degrees abduction, 110 degrees forward elevation passively.  Incisions are healing well.  Axillary nerve is intact with deltoid firing.  2+ radial pulse of the operative extremity.  Intact EPL, FPL, finger abduction.  No evidence of change in bicep contour compared with last visit.  She has very little coarse grinding or crepitus noted around the area of the rotator cuff tear repair.  Good early rotator cuff strength of supra and infra rated 5 -/5.  5/5 subscap strength.  Plan is to progress to working with outpatient physical therapy.  She will do this at [facility] in [place] where she has gone before.  She is okay for full passive range of motion and some active assisted range of motion but no full active range of motion or rotator cuff strengthening until 5/27 which will mark about 6 weeks out from surgery.  Follow-up in 4 weeks.  Follow-Up Instructions: No follow-ups on file.   Orders:  Orders  Placed This Encounter  Procedures   Ambulatory referral to Physical Therapy   Meds ordered this encounter  Medications   methocarbamol  (ROBAXIN ) 500 MG tablet    Sig: Take 1 tablet (500 mg total) by mouth every 8 (eight) hours as needed for muscle spasms.    Dispense:  30 tablet    Refill:  1   oxyCODONE  (ROXICODONE ) 5 MG immediate release tablet    Sig: Take 1 tablet (5 mg total) by mouth every 8 (eight) hours as needed for severe pain (pain score 7-10). Do not take with Xanax or Ambien    Dispense:  30 tablet    Refill:  0    Imaging: No results found.  PMFS History: Patient Active Problem List   Diagnosis Date Noted   Complete tear of right rotator cuff 03/13/2024   Degenerative superior labral anterior-to-posterior (SLAP) tear of right shoulder 03/13/2024   Biceps tendonitis, right 03/13/2024   Moderate episode of recurrent major depressive disorder (HCC) 06/16/2022   Pure hypercholesterolemia 06/12/2021   Genital warts 07/09/2020   Prediabetes 06/14/2020   Bilateral carpal tunnel syndrome 07/22/2019   Bipolar disorder without psychotic features (HCC) 01/01/2016   Depression 01/01/2016   Daytime somnolence 01/01/2016   Dependent edema 01/01/2016   Deviated septum 01/01/2016   Generalized anxiety disorder 01/01/2016   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   Heartburn 01/01/2016   Hypertension 01/01/2016   Obesity 01/01/2016  Osteoarthritis 01/01/2016   Peripheral neuropathy 01/01/2016   Snoring 01/01/2016   Sleep disturbance 07/18/2015   Arthritis of ankle 09/07/2014   Ankle fracture, right 07/29/2014   Chronic pain syndrome 08/01/2013   Past Medical History:  Diagnosis Date   Anxiety    Arthritis    ASCUS favor benign 09/2014, 03/2015   negative high risk HPV 09/2014   Broken ankle    Depression    Enlarged thyroid    GERD (gastroesophageal reflux disease)    Heart murmur    HTN (hypertension)     Family History  Problem Relation Age of Onset   Cancer Father         Brain tumor   Other Father        Brain tumor   Heart disease Father    Healthy Mother    Breast cancer Neg Hx     Past Surgical History:  Procedure Laterality Date   ABDOMINAL HYSTERECTOMY  1993   endometriosis with pain   ABDOMINAL HYSTERECTOMY     ANKLE FRACTURE SURGERY Right    ANKLE SURGERY     BICEPT TENODESIS Right 03/01/2024   Procedure: TENODESIS, BICEPS;  Surgeon: Jasmine Mesi, MD;  Location: [facility] OR;  Service: Orthopedics;  Laterality: Right;   CESAREAN SECTION     Neuro stimulator implant     orif lt wrist     SHOULDER ARTHROSCOPY WITH OPEN ROTATOR CUFF REPAIR Right 03/01/2024   Procedure: RIGHT SHOULDER ARTHROSCOPY, DEBRIDEMENT, MINI OPEN BICEP TENODESIS AND ROTATOR CUFF TEAR REPAIR;  Surgeon: Jasmine Mesi, MD;  Location: [facility] OR;  Service: Orthopedics;  Laterality: Right;  RIGHT SHOULDER ARTHROSCOPY, DEBRIDEMENT, BICEPS TENODESIS, MINI OPEN ROTATOR CUFF TEAR REPAIR   TUBAL LIGATION  1997   TUBAL LIGATION     Social History   Occupational History   Occupation: unemployed  Tobacco Use   Smoking status: Some Days    Types: Cigarettes   Smokeless tobacco: Never   Tobacco comments:    uses e-sig 01/08/16  Vaping Use   Vaping status: Every Day  Substance and Sexual Activity   Alcohol use: Yes    Comment: Occas   Drug use: No   Sexual activity: Not Currently    Birth control/protection: Surgical    Comment: HYST

## 2024-03-30 ENCOUNTER — Other Ambulatory Visit: Payer: Self-pay | Admitting: Surgical

## 2024-04-27 ENCOUNTER — Encounter: Admitting: Surgical

## 2024-04-29 ENCOUNTER — Ambulatory Visit (INDEPENDENT_AMBULATORY_CARE_PROVIDER_SITE_OTHER): Admitting: Surgical

## 2024-04-29 DIAGNOSIS — Z9889 Other specified postprocedural states: Secondary | ICD-10-CM

## 2024-05-01 ENCOUNTER — Encounter: Payer: Self-pay | Admitting: Surgical

## 2024-05-01 NOTE — Progress Notes (Signed)
 Post-Op Visit Note   Patient: [name]           Date of Birth: 1966/04/14           MRN: [id number] Visit Date: 04/29/2024 [facility]: [facility]   Assessment & Plan:  Chief Complaint:  Chief Complaint  Patient presents with   Right Shoulder - Routine Post Op, Follow-up     03/01/2024 right shoulder arthroscopy, deb, MOBT, RCR     Visit Diagnoses:  1. S/P rotator cuff repair      Plan: Patient is a 58 year old female who presents s/p right shoulder arthroscopy with rotator cuff tear repair and bicep tenodesis on 03/01/2024.  Doing better than she was at her last visit.  Has been working with physical therapy with 2 sessions so far.  Really taking no medication for pain at this point.  Sleeping well at night.  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  On exam, patient has 25 degrees external rotation, 90 degrees abduction, 135 degrees forward elevation passively.  Incisions are healing well.  Axillary nerve is intact with deltoid firing.  2+ radial pulse of the operative extremity.  Intact EPL, FPL, finger abduction.  No evidence of change in bicep contour compared with last visit.  Still has a little bit of coarse grinding noted around the area of the rotator cuff tear repair which is consistent with last exam.  Excellent strength of all of her rotator cuff musculature with 5/5 strength of supra, infra, subscap.  Negative external rotation lag sign.  Negative Hornblower sign.  Plan at this time is continue with physical therapy.  She is getting more and more functional with each visit.  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  Will plan to see her back in 4 weeks for clinical recheck with Dr. Rozelle Corning and consider releasing her at that time.  Follow-Up  Instructions: No follow-ups on file.   Orders:  No orders of the defined types were placed in this encounter.  No orders of the defined types were placed in this encounter.   Imaging: No results found.  PMFS History: Patient Active Problem List   Diagnosis Date Noted   Complete tear of right rotator cuff 03/13/2024   Degenerative superior labral anterior-to-posterior (SLAP) tear of right shoulder 03/13/2024   Biceps tendonitis, right 03/13/2024   Moderate episode of recurrent major depressive disorder (HCC) 06/16/2022   Pure hypercholesterolemia 06/12/2021   Genital warts 07/09/2020   Prediabetes 06/14/2020   Bilateral carpal tunnel syndrome 07/22/2019   Bipolar disorder without psychotic features (HCC) 01/01/2016   Depression 01/01/2016   Daytime somnolence 01/01/2016   Dependent edema 01/01/2016   Deviated septum 01/01/2016   Generalized anxiety disorder 01/01/2016   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   Heartburn 01/01/2016   Hypertension 01/01/2016   Obesity 01/01/2016   Osteoarthritis 01/01/2016   Peripheral neuropathy 01/01/2016   Snoring 01/01/2016   Sleep disturbance 07/18/2015   Arthritis of ankle 09/07/2014   Ankle fracture, right 07/29/2014   Chronic pain syndrome 08/01/2013   Past Medical History:  Diagnosis Date   Anxiety    Arthritis    ASCUS favor benign 09/2014, 03/2015   negative high risk HPV 09/2014  Broken ankle    Depression    Enlarged thyroid    GERD (gastroesophageal reflux disease)    Heart murmur    HTN (hypertension)     Family History  Problem Relation Age of Onset   Cancer Father        Brain tumor   Other Father        Brain tumor   Heart disease Father    Healthy Mother    Breast cancer Neg Hx     Past Surgical History:  Procedure Laterality Date   ABDOMINAL HYSTERECTOMY  1993   endometriosis with pain   ABDOMINAL HYSTERECTOMY     ANKLE FRACTURE SURGERY Right    ANKLE SURGERY     BICEPT TENODESIS Right 03/01/2024    Procedure: TENODESIS, BICEPS;  Surgeon: Jasmine Mesi, MD;  Location: [facility] OR;  Service: Orthopedics;  Laterality: Right;   CESAREAN SECTION     Neuro stimulator implant     orif lt wrist     SHOULDER ARTHROSCOPY WITH OPEN ROTATOR CUFF REPAIR Right 03/01/2024   Procedure: RIGHT SHOULDER ARTHROSCOPY, DEBRIDEMENT, MINI OPEN BICEP TENODESIS AND ROTATOR CUFF TEAR REPAIR;  Surgeon: Jasmine Mesi, MD;  Location: [facility] OR;  Service: Orthopedics;  Laterality: Right;  RIGHT SHOULDER ARTHROSCOPY, DEBRIDEMENT, BICEPS TENODESIS, MINI OPEN ROTATOR CUFF TEAR REPAIR   TUBAL LIGATION  1997   TUBAL LIGATION     Social History   Occupational History   Occupation: unemployed  Tobacco Use   Smoking status: Some Days    Types: Cigarettes   Smokeless tobacco: Never   Tobacco comments:    uses e-sig 01/08/16  Vaping Use   Vaping status: Every Day  Substance and Sexual Activity   Alcohol use: Yes    Comment: Occas   Drug use: No   Sexual activity: Not Currently    Birth control/protection: Surgical    Comment: HYST

## 2024-05-04 ENCOUNTER — Other Ambulatory Visit: Payer: Self-pay | Admitting: Surgical

## 2024-05-30 ENCOUNTER — Encounter: Admitting: Orthopedic Surgery

## 2024-09-19 ENCOUNTER — Encounter: Payer: Self-pay | Admitting: Radiology

## 2024-10-21 ENCOUNTER — Other Ambulatory Visit (HOSPITAL_BASED_OUTPATIENT_CLINIC_OR_DEPARTMENT_OTHER): Payer: Self-pay | Admitting: Family Medicine

## 2024-10-21 DIAGNOSIS — Z1231 Encounter for screening mammogram for malignant neoplasm of breast: Secondary | ICD-10-CM

## 2024-11-23 ENCOUNTER — Other Ambulatory Visit (HOSPITAL_COMMUNITY): Payer: Self-pay | Admitting: Family Medicine

## 2024-11-23 DIAGNOSIS — Z87891 Personal history of nicotine dependence: Secondary | ICD-10-CM

## 2024-12-08 ENCOUNTER — Encounter (HOSPITAL_COMMUNITY): Payer: Self-pay

## 2024-12-09 ENCOUNTER — Encounter (HOSPITAL_COMMUNITY): Payer: Self-pay

## 2024-12-09 ENCOUNTER — Ambulatory Visit (HOSPITAL_COMMUNITY): Admission: RE | Admit: 2024-12-09 | Source: Ambulatory Visit
